# Patient Record
Sex: Male | Born: 1945 | Race: White | Hispanic: No | Marital: Married | Smoking: Current every day smoker
Health system: Southern US, Community
[De-identification: ages and names within clinical notes are randomized; demographics above are authoritative.]

## PROBLEM LIST (undated history)

## (undated) DIAGNOSIS — D494 Neoplasm of unspecified behavior of bladder: Secondary | ICD-10-CM

## (undated) DIAGNOSIS — I10 Essential (primary) hypertension: Secondary | ICD-10-CM

## (undated) DIAGNOSIS — R399 Unspecified symptoms and signs involving the genitourinary system: Secondary | ICD-10-CM

## (undated) DIAGNOSIS — I359 Nonrheumatic aortic valve disorder, unspecified: Secondary | ICD-10-CM

## (undated) DIAGNOSIS — C679 Malignant neoplasm of bladder, unspecified: Secondary | ICD-10-CM

## (undated) DIAGNOSIS — K219 Gastro-esophageal reflux disease without esophagitis: Secondary | ICD-10-CM

## (undated) DIAGNOSIS — Z8673 Personal history of transient ischemic attack (TIA), and cerebral infarction without residual deficits: Secondary | ICD-10-CM

## (undated) HISTORY — DX: Malignant neoplasm of bladder, unspecified: C67.9

## (undated) HISTORY — PX: CHOLECYSTECTOMY OPEN: SUR202

## (undated) HISTORY — DX: Nonrheumatic aortic valve disorder, unspecified: I35.9

---

## 2002-09-01 ENCOUNTER — Ambulatory Visit (HOSPITAL_BASED_OUTPATIENT_CLINIC_OR_DEPARTMENT_OTHER): Admission: RE | Admit: 2002-09-01 | Discharge: 2002-09-01 | Payer: Self-pay | Admitting: Orthopedic Surgery

## 2004-02-22 ENCOUNTER — Encounter: Admission: RE | Admit: 2004-02-22 | Discharge: 2004-02-22 | Payer: Self-pay | Admitting: Family Medicine

## 2004-09-24 ENCOUNTER — Emergency Department (HOSPITAL_COMMUNITY): Admission: EM | Admit: 2004-09-24 | Discharge: 2004-09-24 | Payer: Self-pay | Admitting: Emergency Medicine

## 2011-10-20 HISTORY — PX: OTHER SURGICAL HISTORY: SHX169

## 2012-07-04 LAB — COMPREHENSIVE METABOLIC PANEL
Albumin: 3.7 g/dL (ref 3.4–5.0)
Anion Gap: 6 — ABNORMAL LOW (ref 7–16)
BUN: 10 mg/dL (ref 7–18)
Calcium, Total: 8.8 mg/dL (ref 8.5–10.1)
Co2: 29 mmol/L (ref 21–32)
EGFR (Non-African Amer.): >60
Osmolality: 261 (ref 275–301)
Potassium: 4.1 mmol/L (ref 3.5–5.1)
SGPT (ALT): 23 U/L (ref 12–78)
Sodium: 130 mmol/L — ABNORMAL LOW (ref 136–145)

## 2012-07-04 LAB — CBC
HCT: 42.3 % (ref 40.0–52.0)
HGB: 15 g/dL (ref 13.0–18.0)
MCH: 32.1 pg (ref 26.0–34.0)
MCHC: 35.6 g/dL (ref 32.0–36.0)
MCV: 90 fL (ref 80–100)
RBC: 4.69 10*6/uL (ref 4.40–5.90)

## 2012-07-04 LAB — PROTIME-INR: Prothrombin Time: 12.2 secs (ref 11.5–14.7)

## 2012-07-05 ENCOUNTER — Observation Stay: Payer: Self-pay | Admitting: Internal Medicine

## 2012-07-05 LAB — URINALYSIS, COMPLETE
Bilirubin,UR: NEGATIVE
Nitrite: NEGATIVE
Protein: NEGATIVE
Specific Gravity: 1.008 (ref 1.003–1.030)

## 2012-07-05 LAB — TROPONIN I
Troponin-I: <0.02 ng/mL
Troponin-I: <0.02 ng/mL

## 2012-07-05 LAB — ETHANOL
Ethanol %: <0.003 % (ref 0.000–0.080)
Ethanol: <3 mg/dL

## 2012-07-05 LAB — CK TOTAL AND CKMB (NOT AT ARMC): CK-MB: 1.3 ng/mL (ref 0.5–3.6)

## 2012-08-29 ENCOUNTER — Encounter: Payer: Self-pay | Admitting: Internal Medicine

## 2012-09-18 ENCOUNTER — Encounter: Payer: Self-pay | Admitting: Internal Medicine

## 2013-07-09 ENCOUNTER — Observation Stay: Payer: Self-pay | Admitting: Internal Medicine

## 2013-07-09 LAB — COMPREHENSIVE METABOLIC PANEL
Albumin: 3.8 g/dL (ref 3.4–5.0)
Alkaline Phosphatase: 62 U/L (ref 50–136)
Anion Gap: 11 (ref 7–16)
BUN: 13 mg/dL (ref 7–18)
Bilirubin,Total: 0.5 mg/dL (ref 0.2–1.0)
Calcium, Total: 9 mg/dL (ref 8.5–10.1)
Co2: 20 mmol/L — ABNORMAL LOW (ref 21–32)
EGFR (African American): >60
EGFR (Non-African Amer.): 55 — ABNORMAL LOW
Glucose: 104 mg/dL — ABNORMAL HIGH (ref 65–99)
Osmolality: 252 (ref 275–301)
Potassium: 3.6 mmol/L (ref 3.5–5.1)
SGOT(AST): 26 U/L (ref 15–37)
SGPT (ALT): 22 U/L (ref 12–78)
Sodium: 125 mmol/L — ABNORMAL LOW (ref 136–145)
Total Protein: 7.2 g/dL (ref 6.4–8.2)

## 2013-07-09 LAB — CBC
HCT: 40.9 % (ref 40.0–52.0)
HGB: 14.3 g/dL (ref 13.0–18.0)
MCHC: 35 g/dL (ref 32.0–36.0)
MCV: 87 fL (ref 80–100)
Platelet: 262 10*3/uL (ref 150–440)
RBC: 4.68 10*6/uL (ref 4.40–5.90)
RDW: 12.4 % (ref 11.5–14.5)

## 2013-07-09 LAB — TROPONIN I: Troponin-I: 0.14 ng/mL — ABNORMAL HIGH

## 2013-07-09 LAB — CK TOTAL AND CKMB (NOT AT ARMC)
CK, Total: 46 U/L (ref 35–232)
CK-MB: 1.1 ng/mL (ref 0.5–3.6)

## 2013-07-10 LAB — CK TOTAL AND CKMB (NOT AT ARMC)
CK, Total: 84 U/L (ref 35–232)
CK-MB: 2.7 ng/mL (ref 0.5–3.6)
CK-MB: 3.2 ng/mL (ref 0.5–3.6)

## 2013-07-10 LAB — URINALYSIS, COMPLETE
Bacteria: NONE SEEN
Bacteria: NONE SEEN
Bilirubin,UR: NEGATIVE
Bilirubin,UR: NEGATIVE
Ketone: NEGATIVE
Leukocyte Esterase: NEGATIVE
Leukocyte Esterase: NEGATIVE
Ph: 5 (ref 4.5–8.0)
Ph: 6 (ref 4.5–8.0)
Protein: NEGATIVE
Protein: NEGATIVE
RBC,UR: 1 /HPF (ref 0–5)
Specific Gravity: 1.005 (ref 1.003–1.030)
Specific Gravity: 1.005 (ref 1.003–1.030)
Squamous Epithelial: NONE SEEN
Squamous Epithelial: <1
WBC UR: 2 /HPF (ref 0–5)
WBC UR: 3 /HPF (ref 0–5)

## 2013-07-10 LAB — BASIC METABOLIC PANEL
BUN: 13 mg/dL (ref 7–18)
Calcium, Total: 8.2 mg/dL — ABNORMAL LOW (ref 8.5–10.1)
Co2: 23 mmol/L (ref 21–32)
EGFR (African American): >60
EGFR (Non-African Amer.): >60
Osmolality: 257 (ref 275–301)
Potassium: 3.5 mmol/L (ref 3.5–5.1)
Sodium: 126 mmol/L — ABNORMAL LOW (ref 136–145)

## 2013-07-10 LAB — TROPONIN I
Troponin-I: 1.1 ng/mL — ABNORMAL HIGH
Troponin-I: 0.63 ng/mL — ABNORMAL HIGH

## 2013-07-10 LAB — SODIUM: Sodium: 126 mmol/L — ABNORMAL LOW (ref 136–145)

## 2014-07-12 ENCOUNTER — Other Ambulatory Visit: Payer: Self-pay | Admitting: Urology

## 2014-07-18 ENCOUNTER — Encounter (HOSPITAL_BASED_OUTPATIENT_CLINIC_OR_DEPARTMENT_OTHER): Payer: Self-pay | Admitting: *Deleted

## 2014-07-20 ENCOUNTER — Encounter (HOSPITAL_BASED_OUTPATIENT_CLINIC_OR_DEPARTMENT_OTHER): Payer: Self-pay | Admitting: *Deleted

## 2014-07-20 NOTE — Progress Notes (Signed)
SPOKE W/ WIFE. NPO AFTER MN. ARRIVE AT 0600. NEEDS ISTAT AND EKG.  

## 2014-07-22 NOTE — H&P (Signed)
History of Present Illness F/u - referred by Dr. Nicky Pugh.     1- gross hematuria - Sep 2015 - pt complained of grossly bloody urine on and off for about 4 months. He also had some occasional dysuria but also some frequency and urgency. He was treated with Cipro and felt like his symptoms and hematuria improved but they returned after he stopped the antibiotic. He has significant exposures. He is a smoker. Also used to mix chemotherapy as a chemotherapy nurse. He's never had radiation or chemotherapy himself. He has no urologic history. He's never had urologic surgery. He has no weak stream. Neurogenic risks include a stroke about 3 years ago. He's had no flank pain or stone passage.    A May 2015 bun was 13, cr 1.02. I did not see a UA.     2-PCa screening -   -May 2015 PSA 1.3  -Sep 2015 normal DRE      September 2015 interval history  The patient returns for cystoscopy. He's had some continued gross hematuria with some clots. He feels like he is voiding with a good stream and getting empty.    CT scan shows irregular anterior bladder wall mass possibly involving the dome. It appears to be T2 possibly T3. May be urachal.   Past Medical History Problems  1. History of Anxiety (300.00) 2. History of arthritis (V13.4) 3. History of asthma (V12.69) 4. History of depression (V11.8) 5. History of esophageal reflux (V12.79) 6. History of hypertension (V12.59) 7. History of stroke (V12.54)  Surgical History Problems  1. History of Cholecystectomy  Current Meds 1. Aspirin 81 MG Oral Tablet;  Therapy: (Recorded:09Sep2015) to Recorded 2. Losartan Potassium-HCTZ 100-25 MG Oral Tablet;  Therapy: (Recorded:09Sep2015) to Recorded 3. Multi-Vitamin TABS;  Therapy: (Recorded:09Sep2015) to Recorded 4. Pantoprazole Sodium 40 MG Oral Tablet Delayed Release;  Therapy: (Recorded:09Sep2015) to Recorded 5. Sertraline HCl - 100 MG Oral Tablet;  Therapy: (Recorded:09Sep2015) to  Recorded 6. Vitamin B-12 TABS;  Therapy: (Recorded:09Sep2015) to Recorded 7. Vitamin D-3 TABS;  Therapy: (Recorded:09Sep2015) to Recorded  Allergies Medication  1. Tagamet  Family History Problems  1. Family history of Deceased : Mother, Father 2. Family history of cardiac disorder (V17.49) : Mother, Grandmother 3. Family history of diabetes mellitus (V18.0) : Mother 4. Family history of prostate cancer (V16.42) : Grandfather 5. Family history of stroke (V17.1) : Mother  Social History Problems  1. Alcohol use (V49.89) 2. Caffeine use (V49.89) 3. Married 4. Retired 73. Smokes cigarettes (305.1)  Physical Exam Constitutional: Well nourished and well developed . No acute distress.  Pulmonary: No respiratory distress and normal respiratory rhythm and effort.  Cardiovascular: Heart rate and rhythm are normal . No peripheral edema.  Abdomen: The abdomen is soft and nontender. Bladder is not distended. No masses are palpated. No CVA tenderness. No hernias are palpable. No hepatosplenomegaly noted.  Neuro/Psych:. Mood and affect are appropriate.    Results/Data  The following images/tracing/specimen were independently visualized: Marland Kitchen    Procedure  Procedure: Cystoscopy   Indication: Hematuria. History of Urothelial Carcinoma.  Informed Consent: Risks, benefits, and potential adverse events were discussed and informed consent was obtained from the patient.  Prep: The patient was prepped with betadine.  Antibiotic prophylaxis: Ciprofloxacin.  Procedure Note:  Urethral meatus:. No abnormalities.  Anterior urethra: No abnormalities.  Prostatic urethra: No abnormalities.  Bladder: Visulization was clear. The ureteral orifices were in the normal anatomic position bilaterally and had clear efflux of urine. Multiple tumors were identified in  the bladder. This tumor was located near the dome of the bladder. The patient tolerated the procedure well.  Complications: None.     Plan Gross hematuria  1. Cysto; Status:Hold For - Date of Service; Requested [id number];  Health Maintenance  2. UA With REFLEX; [Do Not Release]; Status:In Progress - Specimen/Data Collected;    Done: 23Sep2015  Discussion/Summary We had a long discussion about the CT and cystoscopic findings. We discussed the nature of bladder neoplasms and the nature risk and benefits of TURBT. He did not want to bring his wife back for discussion. He said he wanted to tell her. Initially he said he wanted to do nothing. He said he did not want to do a TURBT. We discussed alternatives such as doing nothing. We discussed these tumors can metastasize quickly and be life threatening. He said he wanted to go home and sit on his porch and enjoy the few days he has left. I told him we needed to get more information and take things a step at a time. He asked about long-term implications and we discussed it depends on the findings on exam under anesthesia and TURBT, but management generally involve endoscopic surgery and sometimes cystectomy. Again we will know more after we do an exam under anesthesia, get a better look at the tumor in the OR and get some pathology. I don't palpate any SP masses today. All questions answered. He elected to proceed. I left the room for him to void which he did successfully. When I came back with the order sheet for surgery he had left. I went to find him and he did not stop at the desk to checkout. I have a front desk clerk calling him now to check on him.      Signatures Electronically signed by : Festus Aloe, M.D.; Jul 11 2014  1:37PM EST

## 2014-07-24 ENCOUNTER — Ambulatory Visit (HOSPITAL_BASED_OUTPATIENT_CLINIC_OR_DEPARTMENT_OTHER): Payer: Medicare HMO | Admitting: Anesthesiology

## 2014-07-24 ENCOUNTER — Encounter (HOSPITAL_BASED_OUTPATIENT_CLINIC_OR_DEPARTMENT_OTHER): Payer: Medicare HMO | Admitting: Anesthesiology

## 2014-07-24 ENCOUNTER — Ambulatory Visit (HOSPITAL_BASED_OUTPATIENT_CLINIC_OR_DEPARTMENT_OTHER)
Admission: RE | Admit: 2014-07-24 | Discharge: 2014-07-24 | Disposition: A | Discharge status: Home / Self Care | Payer: Medicare HMO | Source: Ambulatory Visit | Attending: Urology | Admitting: Urology

## 2014-07-24 ENCOUNTER — Encounter (HOSPITAL_BASED_OUTPATIENT_CLINIC_OR_DEPARTMENT_OTHER): Payer: Self-pay | Admitting: Anesthesiology

## 2014-07-24 ENCOUNTER — Encounter (HOSPITAL_BASED_OUTPATIENT_CLINIC_OR_DEPARTMENT_OTHER)
Admission: RE | Disposition: A | Discharge status: Home / Self Care | Payer: Self-pay | Source: Ambulatory Visit | Attending: Urology

## 2014-07-24 DIAGNOSIS — D494 Neoplasm of unspecified behavior of bladder: Secondary | ICD-10-CM | POA: Diagnosis present

## 2014-07-24 DIAGNOSIS — F329 Major depressive disorder, single episode, unspecified: Secondary | ICD-10-CM | POA: Diagnosis not present

## 2014-07-24 DIAGNOSIS — Z8673 Personal history of transient ischemic attack (TIA), and cerebral infarction without residual deficits: Secondary | ICD-10-CM | POA: Diagnosis not present

## 2014-07-24 DIAGNOSIS — D090 Carcinoma in situ of bladder: Secondary | ICD-10-CM | POA: Insufficient documentation

## 2014-07-24 DIAGNOSIS — F419 Anxiety disorder, unspecified: Secondary | ICD-10-CM | POA: Insufficient documentation

## 2014-07-24 DIAGNOSIS — I10 Essential (primary) hypertension: Secondary | ICD-10-CM | POA: Diagnosis not present

## 2014-07-24 DIAGNOSIS — J45909 Unspecified asthma, uncomplicated: Secondary | ICD-10-CM | POA: Insufficient documentation

## 2014-07-24 DIAGNOSIS — F17210 Nicotine dependence, cigarettes, uncomplicated: Secondary | ICD-10-CM | POA: Insufficient documentation

## 2014-07-24 DIAGNOSIS — M1990 Unspecified osteoarthritis, unspecified site: Secondary | ICD-10-CM | POA: Diagnosis not present

## 2014-07-24 DIAGNOSIS — K219 Gastro-esophageal reflux disease without esophagitis: Secondary | ICD-10-CM | POA: Diagnosis not present

## 2014-07-24 HISTORY — DX: Essential (primary) hypertension: I10

## 2014-07-24 HISTORY — PX: TRANSURETHRAL RESECTION OF BLADDER TUMOR: SHX2575

## 2014-07-24 HISTORY — DX: Personal history of transient ischemic attack (TIA), and cerebral infarction without residual deficits: Z86.73

## 2014-07-24 HISTORY — DX: Neoplasm of unspecified behavior of bladder: D49.4

## 2014-07-24 HISTORY — DX: Unspecified symptoms and signs involving the genitourinary system: R39.9

## 2014-07-24 HISTORY — DX: Gastro-esophageal reflux disease without esophagitis: K21.9

## 2014-07-24 LAB — POCT I-STAT 4, (NA,K, GLUC, HGB,HCT)
Glucose, Bld: 107 mg/dL — ABNORMAL HIGH (ref 70–99)
HEMATOCRIT: 42 % (ref 39.0–52.0)
HEMOGLOBIN: 14.3 g/dL (ref 13.0–17.0)
Potassium: 4.2 mEq/L (ref 3.7–5.3)
Sodium: 127 mEq/L — ABNORMAL LOW (ref 137–147)

## 2014-07-24 SURGERY — TURBT (TRANSURETHRAL RESECTION OF BLADDER TUMOR)
Anesthesia: General | Site: Bladder

## 2014-07-24 MED ORDER — OXYCODONE HCL 5 MG/5ML PO SOLN
5.0000 mg | Freq: Once | ORAL | Status: DC | PRN
  Filled 2014-07-24: qty 5

## 2014-07-24 MED ORDER — OXYBUTYNIN CHLORIDE 5 MG PO TABS: 5.0000 mg | ORAL_TABLET | Freq: Three times a day (TID) | ORAL | Status: DC | PRN

## 2014-07-24 MED ORDER — FENTANYL CITRATE 0.05 MG/ML IJ SOLN
INTRAMUSCULAR | Status: AC
  Filled 2014-07-24: qty 6

## 2014-07-24 MED ORDER — CEFAZOLIN SODIUM-DEXTROSE 2-3 GM-% IV SOLR
2.0000 g | INTRAVENOUS | Status: AC
  Administered 2014-07-24: 2 g via INTRAVENOUS
  Filled 2014-07-24: qty 50

## 2014-07-24 MED ORDER — FENTANYL CITRATE 0.05 MG/ML IJ SOLN
INTRAMUSCULAR | Status: DC | PRN
  Administered 2014-07-24 (×3): 12.5 ug via INTRAVENOUS
  Administered 2014-07-24 (×2): 25 ug via INTRAVENOUS
  Administered 2014-07-24: 12.5 ug via INTRAVENOUS
  Administered 2014-07-24: 25 ug via INTRAVENOUS
  Administered 2014-07-24: 12.5 ug via INTRAVENOUS
  Administered 2014-07-24: 25 ug via INTRAVENOUS
  Administered 2014-07-24: 12.5 ug via INTRAVENOUS
  Administered 2014-07-24: 25 ug via INTRAVENOUS

## 2014-07-24 MED ORDER — BELLADONNA ALKALOIDS-OPIUM 16.2-60 MG RE SUPP
RECTAL | Status: DC | PRN
  Administered 2014-07-24: 1 via RECTAL

## 2014-07-24 MED ORDER — LACTATED RINGERS IV SOLN
INTRAVENOUS | Status: DC
Start: 2014-07-24 — End: 2014-07-24
  Administered 2014-07-24: 07:00:00 via INTRAVENOUS
  Filled 2014-07-24: qty 1000

## 2014-07-24 MED ORDER — MEPERIDINE HCL 25 MG/ML IJ SOLN
6.2500 mg | INTRAMUSCULAR | Status: DC | PRN
  Filled 2014-07-24: qty 1

## 2014-07-24 MED ORDER — LIDOCAINE HCL 2 % EX GEL
CUTANEOUS | Status: DC | PRN
  Administered 2014-07-24: 1 via URETHRAL

## 2014-07-24 MED ORDER — EPHEDRINE SULFATE 50 MG/ML IJ SOLN
INTRAMUSCULAR | Status: DC | PRN
  Administered 2014-07-24 (×3): 10 mg via INTRAVENOUS

## 2014-07-24 MED ORDER — PROMETHAZINE HCL 25 MG/ML IJ SOLN
6.2500 mg | INTRAMUSCULAR | Status: DC | PRN
  Filled 2014-07-24: qty 1

## 2014-07-24 MED ORDER — OXYCODONE HCL 5 MG PO TABS
5.0000 mg | ORAL_TABLET | Freq: Once | ORAL | Status: DC | PRN
  Filled 2014-07-24: qty 1

## 2014-07-24 MED ORDER — SODIUM CHLORIDE 0.9 % IR SOLN
Status: DC | PRN
  Administered 2014-07-24: 36000 mL via INTRAVESICAL

## 2014-07-24 MED ORDER — LIDOCAINE HCL (CARDIAC) 20 MG/ML IV SOLN
INTRAVENOUS | Status: DC | PRN
  Administered 2014-07-24: 100 mg via INTRAVENOUS

## 2014-07-24 MED ORDER — STERILE WATER FOR IRRIGATION IR SOLN
Status: DC | PRN
  Administered 2014-07-24: 500 mL

## 2014-07-24 MED ORDER — CEPHALEXIN 500 MG PO CAPS: 500.0000 mg | ORAL_CAPSULE | Freq: Every day | ORAL | Status: DC

## 2014-07-24 MED ORDER — ACETAMINOPHEN 10 MG/ML IV SOLN
INTRAVENOUS | Status: DC | PRN
  Administered 2014-07-24: 1000 mg via INTRAVENOUS

## 2014-07-24 MED ORDER — ONDANSETRON HCL 4 MG/2ML IJ SOLN
INTRAMUSCULAR | Status: DC | PRN
  Administered 2014-07-24: 4 mg via INTRAVENOUS

## 2014-07-24 MED ORDER — LACTATED RINGERS IV SOLN
INTRAVENOUS | Status: DC | PRN
  Administered 2014-07-24 (×2): via INTRAVENOUS

## 2014-07-24 MED ORDER — MIDAZOLAM HCL 5 MG/5ML IJ SOLN
INTRAMUSCULAR | Status: DC | PRN
  Administered 2014-07-24: 1 mg via INTRAVENOUS
  Administered 2014-07-24 (×2): 0.5 mg via INTRAVENOUS

## 2014-07-24 MED ORDER — CEFAZOLIN SODIUM 1-5 GM-% IV SOLN
1.0000 g | INTRAVENOUS | Status: DC
  Filled 2014-07-24: qty 50

## 2014-07-24 MED ORDER — PROPOFOL 10 MG/ML IV BOLUS
INTRAVENOUS | Status: DC | PRN
Start: 2014-07-24 — End: 2014-07-24
  Administered 2014-07-24: 200 mg via INTRAVENOUS

## 2014-07-24 MED ORDER — BELLADONNA ALKALOIDS-OPIUM 16.2-60 MG RE SUPP
RECTAL | Status: AC
  Filled 2014-07-24: qty 1

## 2014-07-24 MED ORDER — MIDAZOLAM HCL 2 MG/2ML IJ SOLN
INTRAMUSCULAR | Status: AC
  Filled 2014-07-24: qty 2

## 2014-07-24 MED ORDER — HYDROMORPHONE HCL 1 MG/ML IJ SOLN
0.2500 mg | INTRAMUSCULAR | Status: DC | PRN
  Filled 2014-07-24: qty 1

## 2014-07-24 MED ORDER — DEXAMETHASONE SODIUM PHOSPHATE 4 MG/ML IJ SOLN
INTRAMUSCULAR | Status: DC | PRN
  Administered 2014-07-24: 10 mg via INTRAVENOUS

## 2014-07-24 MED ORDER — GLYCOPYRROLATE 0.2 MG/ML IJ SOLN
INTRAMUSCULAR | Status: DC | PRN
  Administered 2014-07-24: 0.2 mg via INTRAVENOUS

## 2014-07-24 MED ORDER — CEFAZOLIN SODIUM-DEXTROSE 2-3 GM-% IV SOLR
INTRAVENOUS | Status: AC
  Filled 2014-07-24: qty 50

## 2014-07-24 SURGICAL SUPPLY — 34 items
BAG DRAIN URO-CYSTO SKYTR STRL (DRAIN) ×3 IMPLANT
BAG DRN ANRFLXCHMBR STRAP LEK (BAG)
BAG DRN UROCATH (DRAIN) ×1
BAG URINE DRAINAGE (UROLOGICAL SUPPLIES) ×2 IMPLANT
BAG URINE LEG 19OZ MD ST LTX (BAG) IMPLANT
CANISTER SUCT LVC 12 LTR MEDI- (MISCELLANEOUS) ×12 IMPLANT
CATH FOLEY 2WAY SLVR  5CC 20FR (CATHETERS)
CATH FOLEY 2WAY SLVR  5CC 22FR (CATHETERS)
CATH FOLEY 2WAY SLVR 5CC 20FR (CATHETERS) IMPLANT
CATH FOLEY 2WAY SLVR 5CC 22FR (CATHETERS) IMPLANT
CLOTH BEACON ORANGE TIMEOUT ST (SAFETY) ×3 IMPLANT
DRAPE CAMERA CLOSED 9X96 (DRAPES) ×3 IMPLANT
DRESSING TELFA 8X3 (GAUZE/BANDAGES/DRESSINGS) IMPLANT
ELECT BUTTON BIOP 24F 90D PLAS (MISCELLANEOUS) IMPLANT
ELECT LOOP HF 26F 30D .35MM (CUTTING LOOP) IMPLANT
ELECT LOOP MED HF 24F 12D CBL (CLIP) ×2 IMPLANT
ELECT REM PT RETURN 9FT ADLT (ELECTROSURGICAL)
ELECTRODE REM PT RTRN 9FT ADLT (ELECTROSURGICAL) ×1 IMPLANT
EVACUATOR MICROVAS BLADDER (UROLOGICAL SUPPLIES) IMPLANT
FOLEY ×2 IMPLANT
GLOVE BIO SURGEON STRL SZ7.5 (GLOVE) ×1 IMPLANT
GLOVE ECLIPSE 6.0 STRL STRAW (GLOVE) ×2 IMPLANT
GLOVE ECLIPSE 6.5 STRL STRAW (GLOVE) ×2 IMPLANT
GLOVE ECLIPSE 7.0 STRL STRAW (GLOVE) ×2 IMPLANT
GLOVE ECLIPSE 7.5 STRL STRAW (GLOVE) ×2 IMPLANT
GOWN STRL REIN XL XLG (GOWN DISPOSABLE) ×1 IMPLANT
GOWN STRL REUS W/ TWL XL LVL3 (GOWN DISPOSABLE) IMPLANT
GOWN STRL REUS W/TWL XL LVL3 (GOWN DISPOSABLE) ×3
HOLDER FOLEY CATH W/STRAP (MISCELLANEOUS) ×2 IMPLANT
LOOP CUTTING 24FR OLYMPUS (CUTTING LOOP) IMPLANT
PACK CYSTO (CUSTOM PROCEDURE TRAY) ×3 IMPLANT
PLUG CATH AND CAP STER (CATHETERS) IMPLANT
SET ASPIRATION TUBING (TUBING) ×2 IMPLANT
SYRINGE IRR TOOMEY STRL 70CC (SYRINGE) ×2 IMPLANT

## 2014-07-24 NOTE — Anesthesia Preprocedure Evaluation (Signed)
Anesthesia Evaluation  Patient identified by MRN, date of birth, ID band Patient awake    Reviewed: Allergy & Precautions, H&P , NPO status , Patient's Chart, lab work & pertinent test results  Airway Mallampati: II TM Distance: >3 FB Neck ROM: Full    Dental no notable dental hx.    Pulmonary Current Smoker,  breath sounds clear to auscultation  Pulmonary exam normal       Cardiovascular hypertension, Pt. on medications Rhythm:Regular Rate:Normal     Neuro/Psych TIAnegative psych ROS   GI/Hepatic Neg liver ROS, GERD-  Medicated,  Endo/Other  negative endocrine ROS  Renal/GU negative Renal ROS     Musculoskeletal negative musculoskeletal ROS (+)   Abdominal   Peds  Hematology negative hematology ROS (+)   Anesthesia Other Findings   Reproductive/Obstetrics                           Anesthesia Physical Anesthesia Plan  ASA: II  Anesthesia Plan: General   Post-op Pain Management:    Induction: Intravenous  Airway Management Planned: LMA  Additional Equipment:   Intra-op Plan:   Post-operative Plan: Extubation in OR  Informed Consent: I have reviewed the patients History and Physical, chart, labs and discussed the procedure including the risks, benefits and alternatives for the proposed anesthesia with the patient or authorized representative who has indicated his/her understanding and acceptance.   Dental advisory given  Plan Discussed with: CRNA  Anesthesia Plan Comments:         Anesthesia Quick Evaluation

## 2014-07-24 NOTE — Interval H&P Note (Signed)
History and Physical Interval Note:  07/24/2014 7:33 AM  Donald Decker  has presented today for surgery, with the diagnosis of Bladder Neoplasm  The various methods of treatment have been discussed with the patient and family. After consideration of risks, benefits and other options for treatment, the patient has consented to  Procedure(s): TRANSURETHRAL RESECTION OF BLADDER TUMOR (TURBT) (N/A) as a surgical intervention .  The patient's history has been reviewed, patient examined, no change in status, stable for surgery.  I have reviewed the patient's chart and labs.  Questions were answered to the patient's satisfaction.  Pt having frequency and gross hematuria -- likely from large tumor. Discussed side effects of the proposed treatment, the likelihood of the patient achieving the goals of the procedure, and any potential problems that might occur during the procedure or recuperation. All questions answered. Patient elects to proceed. Discussed likely need for foley post-op and possible need for repeat TURBT in a few weeks for staging purposes. Pt sodium low but he and wife report that is normal for patient. Has had low sodium for many yrs.     Festus Aloe

## 2014-07-24 NOTE — Addendum Note (Signed)
Addendum created 07/24/14 1131 by Justice Rocher, CRNA   Modules edited: Charges VN

## 2014-07-24 NOTE — Discharge Instructions (Signed)
Foley Catheter Care °A Foley catheter is a soft, flexible tube that is placed into the bladder to drain urine. A Foley catheter may be inserted if: °· You leak urine or are not able to control when you urinate (urinary incontinence). °· You are not able to urinate when you need to (urinary retention). °· You had prostate surgery or surgery on the genitals. °· You have certain medical conditions, such as multiple sclerosis, dementia, or a spinal cord injury. °If you are going home with a Foley catheter in place, follow the instructions below. °TAKING CARE OF THE CATHETER °1. Wash your hands with soap and water. °2. Using mild soap and warm water on a clean washcloth: °· Clean the area on your body closest to the catheter insertion site using a circular motion, moving away from the catheter. Never wipe toward the catheter because this could sweep bacteria up into the urethra and cause infection. °· Remove all traces of soap. Pat the area dry with a clean towel. For males, reposition the foreskin. °3. Attach the catheter to your leg so there is no tension on the catheter. Use adhesive tape or a leg strap. If you are using adhesive tape, remove any sticky residue left behind by the previous tape you used. °4. Keep the drainage bag below the level of the bladder, but keep it off the floor. °5. Check throughout the day to be sure the catheter is working and urine is draining freely. Make sure the tubing does not become kinked. °6. Do not pull on the catheter or try to remove it. Pulling could damage internal tissues. °TAKING CARE OF THE DRAINAGE BAGS °You will be given two drainage bags to take home. One is a large overnight drainage bag, and the other is a smaller leg bag that fits underneath clothing. You may wear the overnight bag at any time, but you should never wear the smaller leg bag at night. Follow the instructions below for how to empty, change, and clean your drainage bags. °Emptying the Drainage Bag °You must  empty your drainage bag when it is  -½ full or at least 2-3 times a day. °1. Wash your hands with soap and water. °2. Keep the drainage bag below your hips, below the level of your bladder. This stops urine from going back into the tubing and into your bladder. °3. Hold the dirty bag over the toilet or a clean container. °4. Open the pour spout at the bottom of the bag and empty the urine into the toilet or container. Do not let the pour spout touch the toilet, container, or any other surface. Doing so can place bacteria on the bag, which can cause an infection. °5. Clean the pour spout with a gauze pad or cotton ball that has rubbing alcohol on it. °6. Close the pour spout. °7. Attach the bag to your leg with adhesive tape or a leg strap. °8. Wash your hands well. °Changing the Drainage Bag °Change your drainage bag once a month or sooner if it starts to smell bad or look dirty. Below are steps to follow when changing the drainage bag. °1. Wash your hands with soap and water. °2. Pinch off the rubber catheter so that urine does not spill out. °3. Disconnect the catheter tube from the drainage tube at the connection valve. Do not let the tubes touch any surface. °4. Clean the end of the catheter tube with an alcohol wipe. Use a different alcohol wipe to clean the   end of the drainage tube. 5. Connect the catheter tube to the drainage tube of the clean drainage bag. 6. Attach the new bag to the leg with adhesive tape or a leg strap. Avoid attaching the new bag too tightly. 7. Wash your hands well. Cleaning the Drainage Bag 1. Wash your hands with soap and water. 2. Wash the bag in warm, soapy water. 3. Rinse the bag thoroughly with warm water. 4. Fill the bag with a solution of white vinegar and water (1 cup vinegar to 1 qt warm water [.2 L vinegar to 1 L warm water]). Close the bag and soak it for 30 minutes in the solution. 5. Rinse the bag with warm water. 6. Hang the bag to dry with the pour spout open  and hanging downward. 7. Store the clean bag (once it is dry) in a clean plastic bag. 8. Wash your hands well. PREVENTING INFECTION  Wash your hands before and after handling your catheter.  Take showers daily and wash the area where the catheter enters your body. Do not take baths. Replace wet leg straps with dry ones, if this applies.  Do not use powders, sprays, or lotions on the genital area. Only use creams, lotions, or ointments as directed by your caregiver.  For females, wipe from front to back after each bowel movement.  Drink enough fluids to keep your urine clear or pale yellow unless you have a fluid restriction.  Do not let the drainage bag or tubing touch or lie on the floor.  Wear cotton underwear to absorb moisture and to keep your skin drier. SEEK MEDICAL CARE IF:   Your urine is cloudy or smells unusually bad.  Your catheter becomes clogged.  You are not draining urine into the bag or your bladder feels full.  Your catheter starts to leak. SEEK IMMEDIATE MEDICAL CARE IF:   You have pain, swelling, redness, or pus where the catheter enters the body.  You have pain in the abdomen, legs, lower back, or bladder.  You have a fever.  You see blood fill the catheter, or your urine is pink or red.  You have nausea, vomiting, or chills.  Your catheter gets pulled out. MAKE SURE YOU:   Understand these instructions.  Will watch your condition.  Will get help right away if you are not doing well or get worse. Foley removal: Patient may remove foley catheter Thursday or Friday morning. Or if it's not bothering him, he can keep it and we can remove in office next week on follow-up.    Cystoscopy, Care After Refer to this sheet in the next few weeks. These instructions provide you with information on caring for yourself after your procedure. Your caregiver may also give you more specific instructions. Your treatment has been planned according to current  medical practices, but problems sometimes occur. Call your caregiver if you have any problems or questions after your procedure. HOME CARE INSTRUCTIONS  Things you can do to ease any discomfort after your procedure include:  Drinking enough water and fluids to keep your urine clear or pale yellow.  Taking a warm bath to relieve any burning feelings. SEEK IMMEDIATE MEDICAL CARE IF:   You have an increase in blood in your urine.  You notice blood clots in your urine.  You have difficulty passing urine.  You have the chills.  You have abdominal pain.  You have a fever or persistent symptoms for more than 2-3 days.  You have a  fever and your symptoms suddenly get worse. MAKE SURE YOU:   Understand these instructions.  Will watch your condition.  Will get help right away if you are not doing well or get worse. Document Released: 04/24/2005 Document Revised: 06/07/2013 Document Reviewed: 03/28/2012 [facility] Patient Information 2015 [place], [place]. This information is not intended to replace advice given to you by your health care provider. Make sure you discuss any questions you have with your health care provider.   Transurethral Resection, Bladder Tumor A cancerous growth (tumor) can develop on the inside wall of the bladder. The bladder is the organ that holds urine. One way to remove the tumor is a procedure called a transurethral resection. The tumor is removed (resected) through the tube that carries urine from the bladder out of the body (urethra). No cuts (incisions) are made in the skin. Instead, the procedure is done through a thin telescope, called a resectoscope. Attached to it is a light and usually a tiny camera. The resectoscope is put into the urethra. In men, the urethra opens at the end of the penis. In women, it opens just above the vagina.  A transurethral resection is usually used to remove tumors that have not gotten too big or too deep. These are called Stage 0,  Stage 1 or Stage 2 bladder cancers. LET YOUR CAREGIVER KNOW ABOUT:  On the day of the procedure, your caregivers will need to know the last time you had anything to eat or drink. This includes water, gum, and candy. In advance, make sure they know about:   Any allergies.  All medications you are taking, including:  Herbs, eyedrops, over-the-counter medications and creams.  Blood thinners (anticoagulants), aspirin or other drugs that could affect blood clotting.  Use of steroids (by mouth or as creams).  Previous problems with anesthetics, including local anesthetics.  Possibility of pregnancy, if this applies.  Any history of blood clots.  Any history of bleeding or other blood problems.  Previous surgery.  Smoking history.  Any recent symptoms of colds or infections.  Other health problems. RISKS AND COMPLICATIONS This is usually a safe procedure. Every procedure has risks, though. For a transurethral resection, they include:  Infection. Antibiotic medication would need to be taken.  Bleeding.  Light bleeding may last for several days after the procedure.  If bleeding continues or is heavy, the bladder may need rinsing. Or, a new catheter might be put in for awhile.  Sometimes bed rest is needed.  Urination problems.  Pain and burning can occur when urinating. This usually goes away in a few days.  Scarring from the procedure can block the flow of urine.  Bladder damage.  It can be punctured or torn during removal of the tumor. If this happens, a catheter might be needed for longer. Antibiotics would be taken while the bladder heals.  Urine can leak through the hole or tear into the abdomen. If this happens, surgery may be needed to repair the bladder. BEFORE THE PROCEDURE   A medical evaluation will be done. This may include:  A physical examination.  Urine test. This is to make sure you do not have a urinary tract infection.  Blood tests.  A test that  checks the heart's rhythm (electrocardiogram).  Talking with an anesthesiologist. This is the person who will be in charge of the medication (anesthesia) to keep you from feeling pain during the transurethral resection. You might be asleep during the procedure (general anesthesia) or numb from the  waist down, but awake during the procedure (spinal anesthesia). Ask your surgeon what to expect.  The person who is having a transurethral resection needs to give what is called informed consent. This requires signing a legal paper that gives permission for the procedure. To give informed consent:  You must understand how the procedure is done and why.  You must be told all the risks and benefits of the procedure.  You must sign the consent. Sometimes a legal guardian can do this.  Signing should be witnessed by a healthcare professional.  The day before the surgery, eat only a light dinner. Then, do not eat or drink anything for at least 8 hours before the surgery. Ask your caregiver if it is OK to take any needed medicines with a sip of water.  Arrive at least an hour before the surgery or whenever your surgeon recommends. This will give you time to check in and fill out any needed paperwork. PROCEDURE  The preparation:  You will change into a hospital gown.  A needle will be inserted in your arm. This is an intravenous access tube (IV). Medication will be able to flow directly into your body through this needle.  Small monitors will be put on your body. They are used to check your heart, blood pressure, and oxygen level.  You might be given medication that will help you relax (sedative).  You will be given a general anesthetic or spinal anesthesia.  The procedure:  Once you are asleep or numb from the waist down, your legs will be placed in stirrups.  The resectoscope will be passed through the urethra into the bladder.  Fluid will be passed through the resectoscope. This will fill  the bladder with water.  The surgeon will examine the bladder through the scope. If the scope has a camera, it can take pictures from inside the bladder. They can be projected onto a TV screen.  The surgeon will use various tools to remove the tumor in small pieces. Sometimes a laser (a beam of light energy) is used. Other tools may use electric current.  A tube (catheter) will often be placed so that urine can drain into a bag outside the body. This process helps stop bleeding. This tube keeps blood clots from blocking the urethra.  The procedure usually takes 30 to 45 minutes. AFTER THE PROCEDURE   You will stay in a recovery area until the anesthesia has worn off. Your blood pressure and pulse will be checked every so often. Then you will be taken to a hospital room.  You may continue to get fluids through the IV for awhile.  Some pain is normal. The catheter might be uncomfortable. Pain is usually not severe. If it is, ask for pain medicine.  Your urine may look bloody after a transurethral resection. This is normal.  If bleeding is heavy, a hospital caregiver may rinse out the bladder (irrigation) through the catheter.  Once the urine is clear, the catheter will be taken out.  You will need to stay in the hospital until you can urinate on your own.  Most people stay in the hospital for up to 4 days. PROGNOSIS   Transurethral resection is considered the best way to treat bladder tumors that are not too far along. For most people, the treatment is successful. Sometimes, though, more treatment is needed.  Bladder cancers can come back even after a successful procedure. Because of this, be sure to have a checkup with your  caregiver every 3 to 6 months. If everything is OK for 3 years, you can reduce the checkups to once a year. Document Released: 08/01/2009 Document Revised: 12/28/2011 Document Reviewed: 11/14/2013 [facility] Patient Information 2015 [place], [place]. This information  is not intended to replace advice given to you by your health care provider. Make sure you discuss any questions you have with your health care provider.    Post Anesthesia Home Care Instructions  Activity: Get plenty of rest for the remainder of the day. A responsible adult should stay with you for 24 hours following the procedure.  For the next 24 hours, DO NOT: -Drive a car -Paediatric nurse -Drink alcoholic beverages -Take any medication unless instructed by your physician -Make any legal decisions or sign important papers.  Meals: Start with liquid foods such as gelatin or soup. Progress to regular foods as tolerated. Avoid greasy, spicy, heavy foods. If nausea and/or vomiting occur, drink only clear liquids until the nausea and/or vomiting subsides. Call your physician if vomiting continues.  Special Instructions/Symptoms: Your throat may feel dry or sore from the anesthesia or the breathing tube placed in your throat during surgery. If this causes discomfort, gargle with warm salt water. The discomfort should disappear within 24 hours.

## 2014-07-24 NOTE — Anesthesia Postprocedure Evaluation (Signed)
Anesthesia Post Note  Patient: Donald Decker  Procedure(s) Performed: Procedure(s) (LRB): TRANSURETHRAL RESECTION OF BLADDER TUMOR (TURBT) GREATER THAN 5 CMS (N/A)  Anesthesia type: General  Patient location: PACU  Post pain: Pain level controlled  Post assessment: Post-op Vital signs reviewed  Last Vitals: BP 119/65  Pulse 102  Temp(Src) 36.4 C (Oral)  Resp 18  Ht 5\' 10"  (1.778 m)  Wt 187 lb (84.823 kg)  BMI 26.83 kg/m2  SpO2 96%  Post vital signs: Reviewed  Level of consciousness: sedated  Complications: No apparent anesthesia complications

## 2014-07-24 NOTE — Transfer of Care (Signed)
Immediate Anesthesia Transfer of Care Note  Patient: Donald Decker  Procedure(s) Performed: Procedure(s) (LRB): TRANSURETHRAL RESECTION OF BLADDER TUMOR (TURBT) GREATER THAN 5 CMS (N/A)  Patient Location: PACU  Anesthesia Type: General  Level of Consciousness: awake, sedated, patient cooperative and responds to stimulation  Airway & Oxygen Therapy: Patient Spontanous Breathing and Patient connected to face mask oxygen  Post-op Assessment: Report given to PACU RN, Post -op Vital signs reviewed and stable and Patient moving all extremities  Post vital signs: Reviewed and stable  Complications: No apparent anesthesia complications

## 2014-07-24 NOTE — Op Note (Signed)
Preoperative diagnosis: Bladder neoplasm Postoperative diagnosis: Bladder neoplasm  Procedure: Exam under anesthesia, TURBT greater than 5 cm  Surgeon: Junious Silk  Anesthesia: [place]   Type of anesthesia: Gen.   Findings: On exam under anesthesia the prostate was mildly enlarged but smooth without heart area or nodule. Landmarks were preserved. On bimanual exam there were no palpable bladder or abdominal masses. Nothing was fixed or indurated.  Cystoscopy revealed multifocal bladder tumors scattered along the right bladder neck, middle of the trigone, posterior wall, right wall, left wall. Again at the dome there was a large invasive-appearing bladder tumor along the anterior bladder neck and anterior dome, behind this there was another solid invasive-appearing bladder tumor. Both large tumors were shaved down to the level of the bladder wall but they continued. There were not completely resected. The resection sites were fulgurated with the button and hemostasis was excellent. The trigone and some superficial tumor in the middle of the ureteral orifices were spared. There was excellent efflux throughout the case and at the end after all resection and fulguration. Ureteral orifices were not involved in the resection.  Description of procedure: After consent was obtained patient brought to the operating room. After adequate anesthesia the patient was placed in lithotomy position and prepped and draped in the usual sterile fashion. A timeout was performed to confirm the patient and procedure I did an exam under anesthesia placed a B&O suppository. Cystoscope was passed per urethra and the bladder inspected with the 12 and 70 lens. Photographs taken. The scope was removed and the urethral meatus dilated to 28 [place]. A 26 French resectoscope was passed with the visual obturator. The cold cup was passed biopsy the posterior superficial-appearing tumor. Next the cold cup was used to biopsy the trigone.  These were representative of the superficial-appearing tumor that covered much of the bladder. The loop was then passed and I resected the right bladder neck. This was sent separately. Next the large tumor on the anterior bladder neck and dome was resected and sent as anterior tumor. This was fulgurated with the button. Finally the largest tumor the posterior down was resected. This was sent as posterior dome. This was fulgurated with the button. Hemostasis was excellent at low-pressure. The scope was removed and an 77 French Foley placed. It was left gravity drainage draining clear urine. Patient was awakened taken to the recovery room in stable condition.   Complications: None   Blood loss: Minimal   Drains: 18 French Foley nonlatex   Specimens to pathology: #1 posterior biopsy  #2 trigone biopsy  #3 bladder neck left - TUR #4 anterior dome tumor - TUR #5 posterior dome tumor -TUR  Disposition: Patient stable to PACU

## 2014-07-25 ENCOUNTER — Encounter (HOSPITAL_BASED_OUTPATIENT_CLINIC_OR_DEPARTMENT_OTHER): Payer: Self-pay | Admitting: Urology

## 2014-07-31 ENCOUNTER — Telehealth: Payer: Self-pay | Admitting: Hematology and Oncology

## 2014-07-31 NOTE — Telephone Encounter (Signed)
S/W PATIENT WIFE AND GAVE NP APPT FOR 10/22 @ 1:30 W/DR. SHADAD.  REFERRING DR. 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

## 2014-08-01 ENCOUNTER — Telehealth: Payer: Self-pay | Admitting: Oncology

## 2014-08-01 NOTE — Telephone Encounter (Signed)
C/D 08/01/14 for appt. 08/09/14 °

## 2014-08-09 ENCOUNTER — Other Ambulatory Visit: Payer: Medicare HMO

## 2014-08-09 ENCOUNTER — Other Ambulatory Visit: Payer: Self-pay | Admitting: *Deleted

## 2014-08-09 ENCOUNTER — Ambulatory Visit (HOSPITAL_BASED_OUTPATIENT_CLINIC_OR_DEPARTMENT_OTHER): Payer: Medicare HMO | Admitting: Oncology

## 2014-08-09 ENCOUNTER — Ambulatory Visit: Payer: Medicare HMO

## 2014-08-09 ENCOUNTER — Encounter: Payer: Self-pay | Admitting: Oncology

## 2014-08-09 ENCOUNTER — Telehealth: Payer: Self-pay | Admitting: Oncology

## 2014-08-09 VITALS — BP 155/69 | HR 74 | Temp 97.7°F | Resp 18 | Ht 70.0 in | Wt 189.4 lb

## 2014-08-09 DIAGNOSIS — C678 Malignant neoplasm of overlapping sites of bladder: Secondary | ICD-10-CM

## 2014-08-09 DIAGNOSIS — N289 Disorder of kidney and ureter, unspecified: Secondary | ICD-10-CM

## 2014-08-09 DIAGNOSIS — C670 Malignant neoplasm of trigone of bladder: Secondary | ICD-10-CM | POA: Insufficient documentation

## 2014-08-09 MED ORDER — PROCHLORPERAZINE MALEATE 10 MG PO TABS: 10.0000 mg | ORAL_TABLET | Freq: Four times a day (QID) | ORAL | Status: DC | PRN

## 2014-08-09 NOTE — Telephone Encounter (Signed)
Pt confirmed labs/ov per 10/22 POF,sent msg to add chemo, gave pt AVS....... KJ °

## 2014-08-09 NOTE — Progress Notes (Signed)
Checked in new pt with no financial concerns at this time.  Informed pt of the different foundations that offer copay assistance for chemo if needed as well as if Josem Kaufmann is req I will obtain that. Pt has my card for any questions or concerns.

## 2014-08-09 NOTE — Progress Notes (Signed)
Please see consult note.  

## 2014-08-09 NOTE — Consult Note (Signed)
Reason for Referral: Bladder cancer.   HPI: 68 year old gentleman of [place], [place] he lived the majority of his life. He worked predominantly as a Marine scientist and was also in [employer] for a period of time. He does have a history of hypertension also have a stroke in 2013 that left him with some residual overall weakness. He was in his usual state of health to he started developing symptoms of hematuria last year. He was subsequently evaluated by Dr. Junious Silk from urology. He underwent a CT scan in September of 2015 which showed a bladder mass and the dome area well without any evidence of lymphadenopathy. On 07/24/2014, he underwent TURBT which he tolerated very well. 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 There is also a small cell component was noted in the posterior dome. He tolerated the procedure well but continues to have intermittent hematuria. He is referred to me for evaluation for neoadjuvant chemotherapy.  Clinically, he is otherwise asymptomatic. He does not report any back pain or shoulder pain or flank pain. He does not report any constitutional symptoms her weight loss or appetite changes. He does report fatigue and tiredness but still mobile at this time using a cane. He does not report any headaches, blurred vision, double vision or syncope. He does not report any chest pain, palpitation or leg edema. He does not report any cough, shortness of breath or hemoptysis. He does not report any nausea, vomiting, constipation or diarrhea. He does not report any skeletal complaints. Rest of his review of systems unremarkable.   Past Medical History  Diagnosis Date  . Bladder neoplasm   . Hypertension   . History of TIA (transient ischemic attack)     2013--  NO RESIDUAL  . GERD (gastroesophageal reflux disease)   . Lower urinary tract symptoms (LUTS)   :  Past Surgical History  Procedure Laterality Date  . Cholecystectomy open  AGE 40   . Negative sleep study  2013  . Transurethral resection of bladder tumor N/A 07/24/2014    Procedure: TRANSURETHRAL RESECTION OF BLADDER TUMOR (TURBT) GREATER THAN 5 CMS;  Surgeon: Festus Aloe, MD;  Location: [facility];  Service: Urology;  Laterality: N/A;  :  Current Outpatient Prescriptions  Medication Sig Dispense Refill  . Cholecalciferol (VITAMIN D3) 1000 UNITS CAPS Take 3 capsules by mouth daily.      Marland Kitchen losartan-hydrochlorothiazide (HYZAAR) 100-25 MG per tablet Take 1 tablet by mouth every evening.      . Multiple Vitamins-Minerals (MULTIVITAMIN ADULTS 50+ PO) Take 1 tablet by mouth daily.      Marland Kitchen oxybutynin (DITROPAN) 5 MG tablet Take 1 tablet (5 mg total) by mouth every 8 (eight) hours as needed for bladder spasms.  30 tablet  0  . pantoprazole (PROTONIX) 40 MG tablet Take 40 mg by mouth every evening.      . sertraline (ZOLOFT) 100 MG tablet Take 100 mg by mouth every evening.      . vitamin B-12 (CYANOCOBALAMIN) 1000 MCG tablet Take 1,000 mcg by mouth daily.      . vitamin C (ASCORBIC ACID) 500 MG tablet Take 500 mg by mouth daily.      Marland Kitchen aspirin EC 81 MG tablet Take 81 mg by mouth daily.      . prochlorperazine (COMPAZINE) 10 MG tablet Take 1 tablet (10 mg total) by mouth every 6 (six) hours as needed for nausea or vomiting.  30 tablet  0   No current  facility-administered medications for this visit.     Allergies  Allergen Reactions  . Latex Other (See Comments)    BLISTERING/ BRUISEING  . Tagamet [Cimetidine] Hives  :  No family history on file.:  History   Social History  . Marital Status: Married    Spouse Name: N/A    Number of Children: N/A  . Years of Education: N/A   Occupational History  . Not on file.   Social History Main Topics  . Smoking status: Current Every Day Smoker -- 0.50 packs/day for 50 years    Types: Cigarettes  . Smokeless tobacco: Never Used  . Alcohol Use: 4.2 oz/week    7 Cans of beer per week     Comment:  [employer]  . Drug Use: No  . Sexual Activity: Not on file   Other Topics Concern  . Not on file   Social History Narrative  . No narrative on file  :  Pertinent items are noted in HPI.  Exam: ECOG 1 Blood pressure 155/69, pulse 74, temperature 97.7 F (36.5 C), temperature source Oral, resp. rate 18, height 5\' 10"  (1.778 m), weight 189 lb 6.4 oz (85.911 kg). General appearance: alert and cooperative Head: Normocephalic, without obvious abnormality Neck: no adenopathy Back: negative Resp: clear to auscultation bilaterally Chest wall: no tenderness Cardio: regular rate and rhythm, S1, S2 normal, no murmur, click, rub or gallop GI: soft, non-tender; bowel sounds normal; no masses,  no organomegaly Extremities: extremities normal, atraumatic, no cyanosis or edema Pulses: 2+ and symmetric Skin: Skin color, texture, turgor normal. No rashes or lesions Lymph nodes: Cervical, supraclavicular, and axillary nodes normal.    Assessment and Plan:    68 year old with the following issues:  1. 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 The patient is a status post transurethral resection of the bladder tumor on 07/24/2014 with the pathology revealed high-grade urothelial carcinoma with at least one portion involvement with small cell component. The clinical staging is T3 N0.  The natural course of this disease was discussed with the patient extensively. I agree with Dr. Junious Silk at this point that he will require a radical cystectomy and preferably neoadjuvant chemotherapy. The logistics of administration of a cisplatin based chemotherapy was discussed. Complications that included nausea, vomiting, myelosuppression, neutropenia, neutropenic sepsis, renal toxicity, peripheral neuropathy were discussed. Although he is a marginal candidate for chemotherapy, I think it's reasonable to try especially with his  aggressive histology. Different regimens of chemotherapy have been utilized including cisplatin with etoposide especially in small cell histology is.cisplatin and Gemzar would be a reasonable regimen with fairly reasonable activity. After discussing the risks and benefits he is agreeable to proceed after chemotherapy education class. He will likely receive cisplatin at a reduced dose of 50 mg per meter squared on day 1 with Gemzar at 1000 mg per meter square on day 1 and Gemzar on day 8 of a 21 day cycle. I anticipate starting on 08/16/2014 for a total of 3 cycles.  2. IV access: I discussed with him the possible need for a Port-A-Cath was discussed. Complications that included infection, bleeding or thrombosis were discussed and he declined for the time being.  3. Anti-emetics: Prescription for Compazine was called in today.  4. Renal insufficiency: His baseline creatinine is normal and will continue to follow that closely on cis-platinum.  All questions were answered today.

## 2014-08-13 ENCOUNTER — Telehealth: Payer: Self-pay | Admitting: *Deleted

## 2014-08-13 NOTE — Telephone Encounter (Signed)
Per staff message and POF I have scheduled appts. Advised scheduler of appts. JMW  

## 2014-08-14 ENCOUNTER — Other Ambulatory Visit: Payer: Medicare HMO

## 2014-08-14 ENCOUNTER — Telehealth: Payer: Self-pay | Admitting: Oncology

## 2014-08-14 ENCOUNTER — Encounter: Payer: Self-pay | Admitting: *Deleted

## 2014-08-14 NOTE — Telephone Encounter (Signed)
Called and s/w pt's wife confirming apt, sent updated sch to Chemo Edu class for pt to p/u today..... KJ

## 2014-08-15 ENCOUNTER — Telehealth: Payer: Self-pay | Admitting: *Deleted

## 2014-08-15 NOTE — Telephone Encounter (Signed)
Per staff message and POF I have scheduled appts. Advised scheduler of appts. JMW  

## 2014-08-16 ENCOUNTER — Other Ambulatory Visit: Payer: Medicare HMO

## 2014-08-17 ENCOUNTER — Ambulatory Visit (HOSPITAL_BASED_OUTPATIENT_CLINIC_OR_DEPARTMENT_OTHER): Payer: Medicare HMO

## 2014-08-17 ENCOUNTER — Telehealth: Payer: Self-pay | Admitting: *Deleted

## 2014-08-17 ENCOUNTER — Other Ambulatory Visit (HOSPITAL_BASED_OUTPATIENT_CLINIC_OR_DEPARTMENT_OTHER): Payer: Medicare HMO

## 2014-08-17 VITALS — BP 154/77 | HR 68 | Temp 97.9°F | Resp 19

## 2014-08-17 DIAGNOSIS — C678 Malignant neoplasm of overlapping sites of bladder: Secondary | ICD-10-CM

## 2014-08-17 DIAGNOSIS — Z5111 Encounter for antineoplastic chemotherapy: Secondary | ICD-10-CM

## 2014-08-17 DIAGNOSIS — C670 Malignant neoplasm of trigone of bladder: Secondary | ICD-10-CM

## 2014-08-17 LAB — COMPREHENSIVE METABOLIC PANEL (CC13)
ALK PHOS: 59 U/L (ref 40–150)
ALT: 12 U/L (ref 0–55)
AST: 13 U/L (ref 5–34)
Albumin: 3.7 g/dL (ref 3.5–5.0)
Anion Gap: 9 mEq/L (ref 3–11)
BUN: 13.9 mg/dL (ref 7.0–26.0)
CO2: 26 mEq/L (ref 22–29)
Calcium: 9.6 mg/dL (ref 8.4–10.4)
Chloride: 97 mEq/L — ABNORMAL LOW (ref 98–109)
Creatinine: 1.1 mg/dL (ref 0.7–1.3)
GLUCOSE: 98 mg/dL (ref 70–140)
Potassium: 4.7 mEq/L (ref 3.5–5.1)
SODIUM: 133 meq/L — AB (ref 136–145)
TOTAL PROTEIN: 6.9 g/dL (ref 6.4–8.3)
Total Bilirubin: 0.29 mg/dL (ref 0.20–1.20)

## 2014-08-17 LAB — CBC WITH DIFFERENTIAL/PLATELET
BASO%: 0.8 % (ref 0.0–2.0)
Basophils Absolute: 0.1 10*3/uL (ref 0.0–0.1)
EOS ABS: 0.3 10*3/uL (ref 0.0–0.5)
EOS%: 4.8 % (ref 0.0–7.0)
HCT: 40.2 % (ref 38.4–49.9)
HGB: 13.3 g/dL (ref 13.0–17.1)
LYMPH%: 29.2 % (ref 14.0–49.0)
MCH: 29.3 pg (ref 27.2–33.4)
MCHC: 33.2 g/dL (ref 32.0–36.0)
MCV: 88.4 fL (ref 79.3–98.0)
MONO#: 0.8 10*3/uL (ref 0.1–0.9)
MONO%: 10.8 % (ref 0.0–14.0)
NEUT%: 54.4 % (ref 39.0–75.0)
NEUTROS ABS: 3.8 10*3/uL (ref 1.5–6.5)
PLATELETS: 294 10*3/uL (ref 140–400)
RBC: 4.55 10*6/uL (ref 4.20–5.82)
RDW: 12.7 % (ref 11.0–14.6)
WBC: 7 10*3/uL (ref 4.0–10.3)
lymph#: 2 10*3/uL (ref 0.9–3.3)

## 2014-08-17 MED ORDER — SODIUM CHLORIDE 0.9 % IV SOLN: 48.5000 mg/m2 | Freq: Once | INTRAVENOUS | Status: DC

## 2014-08-17 MED ORDER — DEXAMETHASONE SODIUM PHOSPHATE 20 MG/5ML IJ SOLN
INTRAMUSCULAR | Status: AC
  Filled 2014-08-17: qty 5

## 2014-08-17 MED ORDER — DEXAMETHASONE SODIUM PHOSPHATE 20 MG/5ML IJ SOLN
12.0000 mg | Freq: Once | INTRAMUSCULAR | Status: AC
  Administered 2014-08-17: 12 mg via INTRAVENOUS

## 2014-08-17 MED ORDER — SODIUM CHLORIDE 0.9 % IV SOLN
2000.0000 mg | Freq: Once | INTRAVENOUS | Status: AC
  Administered 2014-08-17: 2000 mg via INTRAVENOUS
  Filled 2014-08-17: qty 52.6

## 2014-08-17 MED ORDER — SODIUM CHLORIDE 0.9 % IV SOLN
150.0000 mg | Freq: Once | INTRAVENOUS | Status: AC
  Administered 2014-08-17: 150 mg via INTRAVENOUS
  Filled 2014-08-17: qty 5

## 2014-08-17 MED ORDER — SODIUM CHLORIDE 0.9 % IV SOLN
Freq: Once | INTRAVENOUS | Status: AC
  Administered 2014-08-17: 09:00:00 via INTRAVENOUS

## 2014-08-17 MED ORDER — PALONOSETRON HCL INJECTION 0.25 MG/5ML
0.2500 mg | Freq: Once | INTRAVENOUS | Status: AC
  Administered 2014-08-17: 0.25 mg via INTRAVENOUS

## 2014-08-17 MED ORDER — PALONOSETRON HCL INJECTION 0.25 MG/5ML
INTRAVENOUS | Status: AC
  Filled 2014-08-17: qty 5

## 2014-08-17 MED ORDER — SODIUM CHLORIDE 0.9 % IV SOLN
100.0000 mg | Freq: Once | INTRAVENOUS | Status: AC
  Administered 2014-08-17: 100 mg via INTRAVENOUS
  Filled 2014-08-17: qty 100

## 2014-08-17 MED ORDER — POTASSIUM CHLORIDE 2 MEQ/ML IV SOLN
Freq: Once | INTRAVENOUS | Status: AC
  Administered 2014-08-17: 09:00:00 via INTRAVENOUS
  Filled 2014-08-17: qty 10

## 2014-08-17 NOTE — Patient Instructions (Signed)
[facility] Discharge Instructions for Patients Receiving Chemotherapy  Today you received the following chemotherapy agents Gemzar/Cisplatin To help prevent nausea and vomiting after your treatment, we encourage you to take your nausea medication as prescribed.  If you develop nausea and vomiting that is not controlled by your nausea medication, call the clinic.   BELOW ARE SYMPTOMS THAT SHOULD BE REPORTED IMMEDIATELY:  *FEVER GREATER THAN 100.5 F  *CHILLS WITH OR WITHOUT FEVER  NAUSEA AND VOMITING THAT IS NOT CONTROLLED WITH YOUR NAUSEA MEDICATION  *UNUSUAL SHORTNESS OF BREATH  *UNUSUAL BRUISING OR BLEEDING  TENDERNESS IN MOUTH AND THROAT WITH OR WITHOUT PRESENCE OF ULCERS  *URINARY PROBLEMS  *BOWEL PROBLEMS  UNUSUAL RASH Items with * indicate a potential emergency and should be followed up as soon as possible.  Feel free to call the clinic you have any questions or concerns. The clinic phone number is [phone].   Gemcitabine injection (Gemzar) What is this medicine? GEMCITABINE (jem SIT a been) is a chemotherapy drug. This medicine is used to treat many types of cancer like breast cancer, lung cancer, pancreatic cancer, and ovarian cancer. This medicine may be used for other purposes; ask your health care provider or pharmacist if you have questions. COMMON BRAND NAME(S): Gemzar What should I tell my health care provider before I take this medicine? They need to know if you have any of these conditions: -blood disorders -infection -kidney disease -liver disease -recent or ongoing radiation therapy -an unusual or allergic reaction to gemcitabine, other chemotherapy, other medicines, foods, dyes, or preservatives -pregnant or trying to get pregnant -breast-feeding How should I use this medicine? This drug is given as an infusion into a vein. It is administered in a hospital or clinic by a specially trained health care professional. Talk to your  pediatrician regarding the use of this medicine in children. Special care may be needed. Overdosage: If you think you have taken too much of this medicine contact a poison control center or emergency room at once. NOTE: This medicine is only for you. Do not share this medicine with others. What if I miss a dose? It is important not to miss your dose. Call your doctor or health care professional if you are unable to keep an appointment. What may interact with this medicine? -medicines to increase blood counts like filgrastim, pegfilgrastim, sargramostim -some other chemotherapy drugs like cisplatin -vaccines Talk to your doctor or health care professional before taking any of these medicines: -acetaminophen -aspirin -ibuprofen -ketoprofen -naproxen This list may not describe all possible interactions. Give your health care provider a list of all the medicines, herbs, non-prescription drugs, or dietary supplements you use. Also tell them if you smoke, drink alcohol, or use illegal drugs. Some items may interact with your medicine. What should I watch for while using this medicine? Visit your doctor for checks on your progress. This drug may make you feel generally unwell. This is not uncommon, as chemotherapy can affect healthy cells as well as cancer cells. Report any side effects. Continue your course of treatment even though you feel ill unless your doctor tells you to stop. In some cases, you may be given additional medicines to help with side effects. Follow all directions for their use. Call your doctor or health care professional for advice if you get a fever, chills or sore throat, or other symptoms of a cold or flu. Do not treat yourself. This drug decreases your body's ability to fight infections. Try to avoid  being around people who are sick. This medicine may increase your risk to bruise or bleed. Call your doctor or health care professional if you notice any unusual bleeding. Be  careful brushing and flossing your teeth or using a toothpick because you may get an infection or bleed more easily. If you have any dental work done, tell your dentist you are receiving this medicine. Avoid taking products that contain aspirin, acetaminophen, ibuprofen, naproxen, or ketoprofen unless instructed by your doctor. These medicines may hide a fever. Women should inform their doctor if they wish to become pregnant or think they might be pregnant. There is a potential for serious side effects to an unborn child. Talk to your health care professional or pharmacist for more information. Do not breast-feed an infant while taking this medicine. What side effects may I notice from receiving this medicine? Side effects that you should report to your doctor or health care professional as soon as possible: -allergic reactions like skin rash, itching or hives, swelling of the face, lips, or tongue -low blood counts - this medicine may decrease the number of white blood cells, red blood cells and platelets. You may be at increased risk for infections and bleeding. -signs of infection - fever or chills, cough, sore throat, pain or difficulty passing urine -signs of decreased platelets or bleeding - bruising, pinpoint red spots on the skin, black, tarry stools, blood in the urine -signs of decreased red blood cells - unusually weak or tired, fainting spells, lightheadedness -breathing problems -chest pain -mouth sores -nausea and vomiting -pain, swelling, redness at site where injected -pain, tingling, numbness in the hands or feet -stomach pain -swelling of ankles, feet, hands -unusual bleeding Side effects that usually do not require medical attention (report to your doctor or health care professional if they continue or are bothersome): -constipation -diarrhea -hair loss -loss of appetite -stomach upset This list may not describe all possible side effects. Call your doctor for medical  advice about side effects. You may report side effects to FDA at 1-800-FDA-1088. Where should I keep my medicine? This drug is given in a hospital or clinic and will not be stored at home. NOTE: This sheet is a summary. It may not cover all possible information. If you have questions about this medicine, talk to your doctor, pharmacist, or health care provider.  2015, Elsevier/Gold Standard. (2008-02-14 18:45:54)  Cisplatin injection What is this medicine? CISPLATIN (SIS pla tin) is a chemotherapy drug. It targets fast dividing cells, like cancer cells, and causes these cells to die. This medicine is used to treat many types of cancer like bladder, ovarian, and testicular cancers. This medicine may be used for other purposes; ask your health care provider or pharmacist if you have questions. COMMON BRAND NAME(S): Platinol, Platinol -AQ What should I tell my health care provider before I take this medicine? They need to know if you have any of these conditions: -blood disorders -hearing problems -kidney disease -recent or ongoing radiation therapy -an unusual or allergic reaction to cisplatin, carboplatin, other chemotherapy, other medicines, foods, dyes, or preservatives -pregnant or trying to get pregnant -breast-feeding How should I use this medicine? This drug is given as an infusion into a vein. It is administered in a hospital or clinic by a specially trained health care professional. Talk to your pediatrician regarding the use of this medicine in children. Special care may be needed. Overdosage: If you think you have taken too much of this medicine contact a poison control  center or emergency room at once. NOTE: This medicine is only for you. Do not share this medicine with others. What if I miss a dose? It is important not to miss a dose. Call your doctor or health care professional if you are unable to keep an appointment. What may interact with this  medicine? -dofetilide -foscarnet -medicines for seizures -medicines to increase blood counts like filgrastim, pegfilgrastim, sargramostim -probenecid -pyridoxine used with altretamine -rituximab -some antibiotics like amikacin, gentamicin, neomycin, polymyxin B, streptomycin, tobramycin -sulfinpyrazone -vaccines -zalcitabine Talk to your doctor or health care professional before taking any of these medicines: -acetaminophen -aspirin -ibuprofen -ketoprofen -naproxen This list may not describe all possible interactions. Give your health care provider a list of all the medicines, herbs, non-prescription drugs, or dietary supplements you use. Also tell them if you smoke, drink alcohol, or use illegal drugs. Some items may interact with your medicine. What should I watch for while using this medicine? Your condition will be monitored carefully while you are receiving this medicine. You will need important blood work done while you are taking this medicine. This drug may make you feel generally unwell. This is not uncommon, as chemotherapy can affect healthy cells as well as cancer cells. Report any side effects. Continue your course of treatment even though you feel ill unless your doctor tells you to stop. In some cases, you may be given additional medicines to help with side effects. Follow all directions for their use. Call your doctor or health care professional for advice if you get a fever, chills or sore throat, or other symptoms of a cold or flu. Do not treat yourself. This drug decreases your body's ability to fight infections. Try to avoid being around people who are sick. This medicine may increase your risk to bruise or bleed. Call your doctor or health care professional if you notice any unusual bleeding. Be careful brushing and flossing your teeth or using a toothpick because you may get an infection or bleed more easily. If you have any dental work done, tell your dentist you are  receiving this medicine. Avoid taking products that contain aspirin, acetaminophen, ibuprofen, naproxen, or ketoprofen unless instructed by your doctor. These medicines may hide a fever. Do not become pregnant while taking this medicine. Women should inform their doctor if they wish to become pregnant or think they might be pregnant. There is a potential for serious side effects to an unborn child. Talk to your health care professional or pharmacist for more information. Do not breast-feed an infant while taking this medicine. Drink fluids as directed while you are taking this medicine. This will help protect your kidneys. Call your doctor or health care professional if you get diarrhea. Do not treat yourself. What side effects may I notice from receiving this medicine? Side effects that you should report to your doctor or health care professional as soon as possible: -allergic reactions like skin rash, itching or hives, swelling of the face, lips, or tongue -signs of infection - fever or chills, cough, sore throat, pain or difficulty passing urine -signs of decreased platelets or bleeding - bruising, pinpoint red spots on the skin, black, tarry stools, nosebleeds -signs of decreased red blood cells - unusually weak or tired, fainting spells, lightheadedness -breathing problems -changes in hearing -gout pain -low blood counts - This drug may decrease the number of white blood cells, red blood cells and platelets. You may be at increased risk for infections and bleeding. -nausea and vomiting -pain, swelling,  redness or irritation at the injection site -pain, tingling, numbness in the hands or feet -problems with balance, movement -trouble passing urine or change in the amount of urine Side effects that usually do not require medical attention (report to your doctor or health care professional if they continue or are bothersome): -changes in vision -loss of appetite -metallic taste in the mouth  or changes in taste This list may not describe all possible side effects. Call your doctor for medical advice about side effects. You may report side effects to FDA at 1-800-FDA-1088. Where should I keep my medicine? This drug is given in a hospital or clinic and will not be stored at home. NOTE: This sheet is a summary. It may not cover all possible information. If you have questions about this medicine, talk to your doctor, pharmacist, or health care provider.  2015, Elsevier/Gold Standard. (2008-01-10 14:40:54)

## 2014-08-17 NOTE — Telephone Encounter (Signed)
Per staff message and POF I have scheduled appts. Advised scheduler of appts. JMW  

## 2014-08-20 ENCOUNTER — Telehealth: Payer: Self-pay | Admitting: *Deleted

## 2014-08-20 NOTE — Telephone Encounter (Signed)
-----   Message from Perlie Gold sent at 08/17/2014  3:04 PM EDT ----- Regarding: Chemo Follow up call First time Gemzar/Cisplatin. Dr. Alen Blew patient. Please call.  Thanks, Barnetta Chapel

## 2014-08-20 NOTE — Telephone Encounter (Signed)
Donald Decker for chemotherapy F/U.  Spoke with spouse Donald Decker.  He is resting and H.O.H. So Donald Decker offered to talk with me.  Patient is doing well.  Reports nausea, took anti-emetic and no vomiting.  Denies any new side effects or symptoms.  Bowel and bladder is functioning well.  Eating and drinking well and I instructed to drink 64 oz minimum daily or at least the day before, of and after treatment.  Denies questions at this time and encouraged to call if needed.  Reviewed how to call after hours in the case of an emergency.

## 2014-08-23 ENCOUNTER — Telehealth: Payer: Self-pay | Admitting: *Deleted

## 2014-08-23 ENCOUNTER — Other Ambulatory Visit (HOSPITAL_BASED_OUTPATIENT_CLINIC_OR_DEPARTMENT_OTHER): Payer: Medicare HMO

## 2014-08-23 ENCOUNTER — Ambulatory Visit (HOSPITAL_BASED_OUTPATIENT_CLINIC_OR_DEPARTMENT_OTHER): Payer: Medicare HMO | Admitting: Oncology

## 2014-08-23 ENCOUNTER — Telehealth: Payer: Self-pay | Admitting: Oncology

## 2014-08-23 VITALS — BP 154/62 | HR 86 | Temp 98.6°F | Resp 19 | Ht 70.0 in | Wt 185.9 lb

## 2014-08-23 DIAGNOSIS — C677 Malignant neoplasm of urachus: Secondary | ICD-10-CM

## 2014-08-23 DIAGNOSIS — C670 Malignant neoplasm of trigone of bladder: Secondary | ICD-10-CM

## 2014-08-23 DIAGNOSIS — N289 Disorder of kidney and ureter, unspecified: Secondary | ICD-10-CM

## 2014-08-23 LAB — CBC WITH DIFFERENTIAL/PLATELET
BASO%: 0.5 % (ref 0.0–2.0)
Basophils Absolute: 0 10*3/uL (ref 0.0–0.1)
EOS%: 0.5 % (ref 0.0–7.0)
Eosinophils Absolute: 0 10*3/uL (ref 0.0–0.5)
HEMATOCRIT: 40.7 % (ref 38.4–49.9)
HGB: 13.7 g/dL (ref 13.0–17.1)
LYMPH%: 25.6 % (ref 14.0–49.0)
MCH: 29.6 pg (ref 27.2–33.4)
MCHC: 33.8 g/dL (ref 32.0–36.0)
MCV: 87.7 fL (ref 79.3–98.0)
MONO#: 0.1 10*3/uL (ref 0.1–0.9)
MONO%: 1.4 % (ref 0.0–14.0)
NEUT#: 4.1 10*3/uL (ref 1.5–6.5)
NEUT%: 72 % (ref 39.0–75.0)
Platelets: 181 10*3/uL (ref 140–400)
RBC: 4.63 10*6/uL (ref 4.20–5.82)
RDW: 12.8 % (ref 11.0–14.6)
WBC: 5.6 10*3/uL (ref 4.0–10.3)
lymph#: 1.4 10*3/uL (ref 0.9–3.3)

## 2014-08-23 LAB — COMPREHENSIVE METABOLIC PANEL (CC13)
ALK PHOS: 60 U/L (ref 40–150)
ALT: 42 U/L (ref 0–55)
AST: 24 U/L (ref 5–34)
Albumin: 4 g/dL (ref 3.5–5.0)
Anion Gap: 8 mEq/L (ref 3–11)
BILIRUBIN TOTAL: 0.55 mg/dL (ref 0.20–1.20)
BUN: 22.3 mg/dL (ref 7.0–26.0)
CO2: 26 meq/L (ref 22–29)
CREATININE: 1.1 mg/dL (ref 0.7–1.3)
Calcium: 9.7 mg/dL (ref 8.4–10.4)
Chloride: 92 mEq/L — ABNORMAL LOW (ref 98–109)
Glucose: 95 mg/dl (ref 70–140)
Potassium: 4.9 mEq/L (ref 3.5–5.1)
Sodium: 126 mEq/L — ABNORMAL LOW (ref 136–145)
Total Protein: 7.5 g/dL (ref 6.4–8.3)

## 2014-08-23 NOTE — Progress Notes (Signed)
Hematology and Oncology Follow Up Visit  Donald Decker [id number] Mar 15, 1946 68 y.o. 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 He had a T3 N0 disease.   Prior Therapy: On 07/24/2014, he underwent TURBT which he tolerated very well. 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 There is also a small cell component was noted in the posterior dome  Current therapy: neoadjuvant systemic chemotherapy with cisplatin and Gemzar cycle one given on 08/17/2014 today is day 8 of cycle 1. Plan for total of 3 cycles.  Interim History:  Donald Decker presents today for a follow-up visit. Since the last visit, he tolerated day 1 cycle 1 of chemotherapy without any complications. He did not report any nausea, vomiting or peripheral neuropathy. He has not reported any major decline in his energy or performance status. He does not report any back pain or shoulder pain or flank pain. He does not report any constitutional symptoms her weight loss or appetite changes. He does report fatigue and tiredness but still mobile at this time using a cane. He does not report any headaches, blurred vision, double vision or syncope. He does not report any chest pain, palpitation or leg edema. He does not report any cough, shortness of breath or hemoptysis. He does not report any nausea, vomiting, constipation or diarrhea. He does not report any skeletal complaints. Rest of his review of systems unremarkable.  Medications: I have reviewed the patient's current medications.  Current Outpatient Prescriptions  Medication Sig Dispense Refill  . aspirin EC 81 MG tablet Take 81 mg by mouth daily.    . Cholecalciferol (VITAMIN D3) 1000 UNITS CAPS Take 3 capsules by mouth daily.    Marland Kitchen losartan-hydrochlorothiazide (HYZAAR) 100-25 MG per tablet Take 1 tablet  by mouth every evening.    . Multiple Vitamins-Minerals (MULTIVITAMIN ADULTS 50+ PO) Take 1 tablet by mouth daily.    Marland Kitchen oxybutynin (DITROPAN) 5 MG tablet Take 1 tablet (5 mg total) by mouth every 8 (eight) hours as needed for bladder spasms. 30 tablet 0  . pantoprazole (PROTONIX) 40 MG tablet Take 40 mg by mouth every evening.    . prochlorperazine (COMPAZINE) 10 MG tablet Take 1 tablet (10 mg total) by mouth every 6 (six) hours as needed for nausea or vomiting. 30 tablet 0  . sertraline (ZOLOFT) 100 MG tablet Take 100 mg by mouth every evening.    . vitamin B-12 (CYANOCOBALAMIN) 1000 MCG tablet Take 1,000 mcg by mouth daily.    . vitamin C (ASCORBIC ACID) 500 MG tablet Take 500 mg by mouth daily.     No current facility-administered medications for this visit.     Allergies:  Allergies  Allergen Reactions  . Latex Other (See Comments)    BLISTERING/ BRUISEING  . Tagamet [Cimetidine] Hives    Past Medical History, Surgical history, Social history, and Family History were reviewed and updated.  Physical Exam: Blood pressure 154/62, pulse 86, temperature 98.6 F (37 C), temperature source Oral, resp. rate 19, height 5\' 10"  (1.778 m), weight 185 lb 14.4 oz (84.324 kg), SpO2 100 %. ECOG: 1 General appearance: alert and cooperative Head: Normocephalic, without obvious abnormality Neck: no adenopathy Lymph nodes: Cervical, supraclavicular, and axillary nodes normal. Heart:regular rate and rhythm, S1, S2 normal, no murmur, click, rub or gallop Lung:chest clear, no wheezing, rales, normal symmetric air entry Abdomin: soft, non-tender, without masses or  organomegaly EXT:no erythema, induration, or nodules   Lab Results: Lab Results  Component Value Date   WBC 5.6 08/23/2014   HGB 13.7 08/23/2014   HCT 40.7 08/23/2014   MCV 87.7 08/23/2014   PLT 181 08/23/2014     Chemistry      Component Value Date/Time   NA 133* 08/17/2014 0757   NA 127* 07/24/2014 0635   K 4.7 08/17/2014  0757   K 4.2 07/24/2014 0635   CO2 26 08/17/2014 0757   BUN 13.9 08/17/2014 0757   CREATININE 1.1 08/17/2014 0757      Component Value Date/Time   CALCIUM 9.6 08/17/2014 0757   ALKPHOS 59 08/17/2014 0757   AST 13 08/17/2014 0757   ALT 12 08/17/2014 0757   BILITOT 0.29 08/17/2014 0757       Impression and Plan:   68 year old with the following issues:  1. 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 The patient is a status post transurethral resection of the bladder tumor on 07/24/2014 with the pathology revealed high-grade urothelial carcinoma with at least one portion involvement with small cell component. The clinical staging is T3 N0. He is currently receiving neoadjuvant chemotherapy with cisplatin and Gemzar. He is ready to proceed with day 8 of cycle 1 after reviewing his laboratory data.  2. IV access: I discussed with him the possible need for a Port-A-Cath was discussed again. Complications that included infection, bleeding or thrombosis were discussed and he declined for the time being.  3. Anti-emetics: Prescription for Compazine was called in previously.  4. Renal insufficiency: His baseline creatinine is normal and will continue to follow that closely on cis-platinum.   UYEBXI,DHWYS, MD 11/5/20151:52 PM

## 2014-08-23 NOTE — Telephone Encounter (Signed)
I have adjsuted 11/25 appt

## 2014-08-23 NOTE — Telephone Encounter (Signed)
Gave avs & cal. Sent mess to move tx to follow appt 11/25.

## 2014-08-24 ENCOUNTER — Other Ambulatory Visit: Payer: Self-pay | Admitting: *Deleted

## 2014-08-24 ENCOUNTER — Ambulatory Visit (HOSPITAL_BASED_OUTPATIENT_CLINIC_OR_DEPARTMENT_OTHER): Payer: Medicare HMO

## 2014-08-24 ENCOUNTER — Encounter: Payer: Self-pay | Admitting: *Deleted

## 2014-08-24 DIAGNOSIS — C670 Malignant neoplasm of trigone of bladder: Secondary | ICD-10-CM

## 2014-08-24 DIAGNOSIS — C677 Malignant neoplasm of urachus: Secondary | ICD-10-CM

## 2014-08-24 DIAGNOSIS — Z5111 Encounter for antineoplastic chemotherapy: Secondary | ICD-10-CM

## 2014-08-24 MED ORDER — SODIUM CHLORIDE 0.9 % IV SOLN
Freq: Once | INTRAVENOUS | Status: AC
  Administered 2014-08-24: 14:00:00 via INTRAVENOUS

## 2014-08-24 MED ORDER — PROCHLORPERAZINE MALEATE 10 MG PO TABS
ORAL_TABLET | ORAL | Status: AC
  Filled 2014-08-24: qty 1

## 2014-08-24 MED ORDER — PROCHLORPERAZINE MALEATE 10 MG PO TABS
10.0000 mg | ORAL_TABLET | Freq: Once | ORAL | Status: AC
  Administered 2014-08-24: 10 mg via ORAL

## 2014-08-24 MED ORDER — SODIUM CHLORIDE 0.9 % IV SOLN
1000.0000 mg/m2 | Freq: Once | INTRAVENOUS | Status: AC
  Administered 2014-08-24: 2052 mg via INTRAVENOUS
  Filled 2014-08-24: qty 53.97

## 2014-08-24 NOTE — Progress Notes (Signed)
[facility]  Clinical Social Work was referred by Therapist, sports  to review and complete healthcare advance directives.  Clinical Social Worker met with patient and wife in [place] office.  The patient designated Calixto Pavel as their primary healthcare agent and Alhassan Everingham as their secondary agent.  Patient also completed healthcare living will.    Clinical Social Worker notarized documents and made copies for patient/family. Clinical Social Worker will send documents to medical records to be scanned into patient's chart. Clinical Social Worker encouraged patient/family to contact with any additional questions or concerns.  Loren Racer, Golf Worker [place]  [facility] Phone: [phone] Fax: [phone]

## 2014-08-24 NOTE — Patient Instructions (Signed)
[facility] Discharge Instructions for Patients Receiving Chemotherapy  Today you received the following chemotherapy agents gemzar  To help prevent nausea and vomiting after your treatment, we encourage you to take your nausea medication as directed.  If you develop nausea and vomiting that is not controlled by your nausea medication, call the clinic.   BELOW ARE SYMPTOMS THAT SHOULD BE REPORTED IMMEDIATELY:  *FEVER GREATER THAN 100.5 F  *CHILLS WITH OR WITHOUT FEVER  NAUSEA AND VOMITING THAT IS NOT CONTROLLED WITH YOUR NAUSEA MEDICATION  *UNUSUAL SHORTNESS OF BREATH  *UNUSUAL BRUISING OR BLEEDING  TENDERNESS IN MOUTH AND THROAT WITH OR WITHOUT PRESENCE OF ULCERS  *URINARY PROBLEMS  *BOWEL PROBLEMS  UNUSUAL RASH Items with * indicate a potential emergency and should be followed up as soon as possible.  Feel free to call the clinic you have any questions or concerns. The clinic phone number is [phone].  

## 2014-08-30 ENCOUNTER — Telehealth: Payer: Self-pay | Admitting: Medical Oncology

## 2014-08-30 ENCOUNTER — Other Ambulatory Visit: Payer: Self-pay | Admitting: Medical Oncology

## 2014-08-30 MED ORDER — OXYBUTYNIN CHLORIDE 5 MG PO TABS: 5.0000 mg | ORAL_TABLET | Freq: Three times a day (TID) | ORAL | Status: DC | PRN

## 2014-08-30 NOTE — Telephone Encounter (Signed)
Ok to refill 

## 2014-08-30 NOTE — Telephone Encounter (Signed)
Wife requesting a refill prescription, for patient, of Oxybutynin 5 mg tablet (for bladder spasms). States original prescription was written by Dr. Junious Silk 10/6. Asking if Dr. Alen Blew would refill this.  Mssg forwarded to MD for review.

## 2014-08-30 NOTE — Telephone Encounter (Signed)
Patient informed of Ditropan refill sent to his requested pharmacy. Patient expressed thanks, no further questions at this time.

## 2014-09-06 ENCOUNTER — Encounter: Payer: Self-pay | Admitting: Physician Assistant

## 2014-09-06 ENCOUNTER — Ambulatory Visit (HOSPITAL_BASED_OUTPATIENT_CLINIC_OR_DEPARTMENT_OTHER): Payer: Medicare HMO | Admitting: Physician Assistant

## 2014-09-06 ENCOUNTER — Other Ambulatory Visit (HOSPITAL_BASED_OUTPATIENT_CLINIC_OR_DEPARTMENT_OTHER): Payer: Medicare HMO

## 2014-09-06 ENCOUNTER — Ambulatory Visit (HOSPITAL_BASED_OUTPATIENT_CLINIC_OR_DEPARTMENT_OTHER): Payer: Medicare HMO

## 2014-09-06 VITALS — BP 147/89 | HR 93 | Temp 98.0°F | Resp 18 | Ht 70.0 in | Wt 188.8 lb

## 2014-09-06 DIAGNOSIS — C670 Malignant neoplasm of trigone of bladder: Secondary | ICD-10-CM

## 2014-09-06 DIAGNOSIS — C677 Malignant neoplasm of urachus: Secondary | ICD-10-CM

## 2014-09-06 DIAGNOSIS — Z5111 Encounter for antineoplastic chemotherapy: Secondary | ICD-10-CM

## 2014-09-06 DIAGNOSIS — E860 Dehydration: Secondary | ICD-10-CM

## 2014-09-06 LAB — CBC WITH DIFFERENTIAL/PLATELET
BASO%: 0.4 % (ref 0.0–2.0)
BASOS ABS: 0 10*3/uL (ref 0.0–0.1)
EOS%: 1.7 % (ref 0.0–7.0)
Eosinophils Absolute: 0.1 10*3/uL (ref 0.0–0.5)
HEMATOCRIT: 34.9 % — AB (ref 38.4–49.9)
HEMOGLOBIN: 11.8 g/dL — AB (ref 13.0–17.1)
LYMPH%: 31.8 % (ref 14.0–49.0)
MCH: 29.4 pg (ref 27.2–33.4)
MCHC: 33.6 g/dL (ref 32.0–36.0)
MCV: 87.4 fL (ref 79.3–98.0)
MONO#: 0.5 10*3/uL (ref 0.1–0.9)
MONO%: 15.6 % — ABNORMAL HIGH (ref 0.0–14.0)
NEUT#: 1.6 10*3/uL (ref 1.5–6.5)
NEUT%: 50.5 % (ref 39.0–75.0)
PLATELETS: 385 10*3/uL (ref 140–400)
RBC: 4 10*6/uL — ABNORMAL LOW (ref 4.20–5.82)
RDW: 12.5 % (ref 11.0–14.6)
WBC: 3.2 10*3/uL — AB (ref 4.0–10.3)
lymph#: 1 10*3/uL (ref 0.9–3.3)

## 2014-09-06 LAB — COMPREHENSIVE METABOLIC PANEL (CC13)
ALT: 12 U/L (ref 0–55)
ANION GAP: 9 meq/L (ref 3–11)
AST: 14 U/L (ref 5–34)
Albumin: 3.8 g/dL (ref 3.5–5.0)
Alkaline Phosphatase: 65 U/L (ref 40–150)
BUN: 9.2 mg/dL (ref 7.0–26.0)
CALCIUM: 9.4 mg/dL (ref 8.4–10.4)
CHLORIDE: 93 meq/L — AB (ref 98–109)
CO2: 26 meq/L (ref 22–29)
Creatinine: 1.1 mg/dL (ref 0.7–1.3)
GLUCOSE: 102 mg/dL (ref 70–140)
Potassium: 4.2 mEq/L (ref 3.5–5.1)
Sodium: 128 mEq/L — ABNORMAL LOW (ref 136–145)
Total Bilirubin: 0.31 mg/dL (ref 0.20–1.20)
Total Protein: 7 g/dL (ref 6.4–8.3)

## 2014-09-06 MED ORDER — SODIUM CHLORIDE 0.9 % IV SOLN
48.5000 mg/m2 | Freq: Once | INTRAVENOUS | Status: AC
  Administered 2014-09-06: 100 mg via INTRAVENOUS
  Filled 2014-09-06: qty 100

## 2014-09-06 MED ORDER — DEXAMETHASONE SODIUM PHOSPHATE 20 MG/5ML IJ SOLN
12.0000 mg | Freq: Once | INTRAMUSCULAR | Status: AC
  Administered 2014-09-06: 12 mg via INTRAVENOUS

## 2014-09-06 MED ORDER — SODIUM CHLORIDE 0.9 % IV SOLN
Freq: Once | INTRAVENOUS | Status: AC
  Administered 2014-09-06: 09:00:00 via INTRAVENOUS

## 2014-09-06 MED ORDER — PALONOSETRON HCL INJECTION 0.25 MG/5ML
0.2500 mg | Freq: Once | INTRAVENOUS | Status: AC
  Administered 2014-09-06: 0.25 mg via INTRAVENOUS

## 2014-09-06 MED ORDER — PALONOSETRON HCL INJECTION 0.25 MG/5ML
INTRAVENOUS | Status: AC
  Filled 2014-09-06: qty 5

## 2014-09-06 MED ORDER — MANNITOL 25 % IV SOLN
Freq: Once | INTRAVENOUS | Status: AC
  Administered 2014-09-06: 10:00:00 via INTRAVENOUS
  Filled 2014-09-06: qty 10

## 2014-09-06 MED ORDER — DEXAMETHASONE SODIUM PHOSPHATE 20 MG/5ML IJ SOLN
INTRAMUSCULAR | Status: AC
  Filled 2014-09-06: qty 5

## 2014-09-06 MED ORDER — SODIUM CHLORIDE 0.9 % IV SOLN
1000.0000 mL | Freq: Once | INTRAVENOUS | Status: AC
  Administered 2014-09-06: 500 mL via INTRAVENOUS

## 2014-09-06 MED ORDER — SODIUM CHLORIDE 0.9 % IV SOLN
150.0000 mg | Freq: Once | INTRAVENOUS | Status: AC
  Administered 2014-09-06: 150 mg via INTRAVENOUS
  Filled 2014-09-06: qty 5

## 2014-09-06 MED ORDER — SODIUM CHLORIDE 0.9 % IV SOLN
500.0000 mL | Freq: Once | INTRAVENOUS | Status: AC
  Administered 2014-09-06: 10:00:00 via INTRAVENOUS

## 2014-09-06 MED ORDER — SODIUM CHLORIDE 0.9 % IV SOLN
1000.0000 mg/m2 | Freq: Once | INTRAVENOUS | Status: AC
  Administered 2014-09-06: 2052 mg via INTRAVENOUS
  Filled 2014-09-06: qty 53.97

## 2014-09-06 NOTE — Patient Instructions (Signed)
[facility] Discharge Instructions for Patients Receiving Chemotherapy  Today you received the following chemotherapy agents Gemzar/Cisplatin To help prevent nausea and vomiting after your treatment, we encourage you to take your nausea medication as prescribed.  If you develop nausea and vomiting that is not controlled by your nausea medication, call the clinic.   BELOW ARE SYMPTOMS THAT SHOULD BE REPORTED IMMEDIATELY:  *FEVER GREATER THAN 100.5 F  *CHILLS WITH OR WITHOUT FEVER  NAUSEA AND VOMITING THAT IS NOT CONTROLLED WITH YOUR NAUSEA MEDICATION  *UNUSUAL SHORTNESS OF BREATH  *UNUSUAL BRUISING OR BLEEDING  TENDERNESS IN MOUTH AND THROAT WITH OR WITHOUT PRESENCE OF ULCERS  *URINARY PROBLEMS  *BOWEL PROBLEMS  UNUSUAL RASH Items with * indicate a potential emergency and should be followed up as soon as possible.  Feel free to call the clinic you have any questions or concerns. The clinic phone number is [phone].

## 2014-09-06 NOTE — Progress Notes (Signed)
Hematology and Oncology Follow Up Visit  Donald Decker [id number] 1945/12/14 68 y.o. 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 He had a T3 N0 disease.   Prior Therapy: On 07/24/2014, he underwent TURBT which he tolerated very well. 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 There is also a small cell component was noted in the posterior dome  Current therapy: neoadjuvant systemic chemotherapy with cisplatin and Gemzar cycle one given on 08/17/2014 today is day 8 of cycle 1. Plan for total of 3 cycles. Status post 1 cycle  Interim History:  Donald Decker presents today for a follow-up visit. She reports decreased energy as well as some weakness in the legs. He does have a history of a stroke approximately 2 years ago. He complains of the vein and that was used for his last chemotherapy had some redness and tenderness after treatment. This has resolved. He is only drinking coffee and tea and no water.  He does not report any nausea, vomiting or peripheral neuropathy. He does not report any back pain or shoulder pain or flank pain. He does not report any constitutional symptoms her weight loss or appetite changes. He does report fatigue and tiredness but still mobile at this time using a cane. He does not report any headaches, blurred vision, double vision or syncope. He does not report any chest pain, palpitation or leg edema. He does not report any cough, shortness of breath or hemoptysis. He does not report any nausea, vomiting, constipation or diarrhea. He does not report any skeletal complaints. Remainder of his review of systems unremarkable.  Medications: I have reviewed the patient's current medications.  Current Outpatient Prescriptions  Medication Sig Dispense Refill  . Cholecalciferol (VITAMIN D3) 1000  UNITS CAPS Take 3 capsules by mouth daily.    . diazepam (VALIUM) 5 MG tablet Take 5 mg by mouth daily.  5  . losartan-hydrochlorothiazide (HYZAAR) 100-25 MG per tablet Take 1 tablet by mouth every evening.    . Multiple Vitamins-Minerals (MULTIVITAMIN ADULTS 50+ PO) Take 1 tablet by mouth daily.    Marland Kitchen oxybutynin (DITROPAN) 5 MG tablet Take 1 tablet (5 mg total) by mouth every 8 (eight) hours as needed for bladder spasms. 30 tablet 0  . pantoprazole (PROTONIX) 40 MG tablet Take 40 mg by mouth every evening.    . prochlorperazine (COMPAZINE) 10 MG tablet Take 1 tablet (10 mg total) by mouth every 6 (six) hours as needed for nausea or vomiting. 30 tablet 0  . sertraline (ZOLOFT) 100 MG tablet Take 100 mg by mouth every evening.    . vitamin B-12 (CYANOCOBALAMIN) 1000 MCG tablet Take 1,000 mcg by mouth daily.    Marland Kitchen aspirin EC 81 MG tablet Take 81 mg by mouth daily.    . vitamin C (ASCORBIC ACID) 500 MG tablet Take 500 mg by mouth daily.     No current facility-administered medications for this visit.   Facility-Administered Medications Ordered in Other Visits  Medication Dose Route Frequency Provider Last Rate Last Dose  . CISplatin (PLATINOL) 100 mg in sodium chloride 0.9 % 250 mL chemo infusion  48.5 mg/m2 (Treatment Plan Actual) Intravenous Once Donald Portela, MD         Allergies:  Allergies  Allergen Reactions  . Latex Other (See Comments)    BLISTERING/ BRUISEING  . Tagamet [Cimetidine] Hives    Past  Medical History, Surgical history, Social history, and Family History were reviewed and updated.  Physical Exam: Blood pressure 147/89, pulse 93, temperature 98 F (36.7 C), temperature source Oral, resp. rate 18, height 5\' 10"  (1.778 m), weight 188 lb 12.8 oz (85.639 kg), SpO2 100 %. ECOG: 1 General appearance: alert and cooperative Head: Normocephalic, without obvious abnormality Neck: no adenopathy Lymph nodes: Cervical, supraclavicular, and axillary nodes normal. Heart:regular  rate and rhythm, S1, S2 normal, no murmur, click, rub or gallop Lung:chest clear, no wheezing, rales, normal symmetric air entry Abdomin: soft, non-tender, without masses or organomegaly EXT:no erythema, induration, or nodules   Lab Results: Lab Results  Component Value Date   WBC 3.2* 09/06/2014   HGB 11.8* 09/06/2014   HCT 34.9* 09/06/2014   MCV 87.4 09/06/2014   PLT 385 09/06/2014     Chemistry      Component Value Date/Time   NA 128* 09/06/2014 0816   NA 127* 07/24/2014 0635   K 4.2 09/06/2014 0816   K 4.2 07/24/2014 0635   CO2 26 09/06/2014 0816   BUN 9.2 09/06/2014 0816   CREATININE 1.1 09/06/2014 0816      Component Value Date/Time   CALCIUM 9.4 09/06/2014 0816   ALKPHOS 65 09/06/2014 0816   AST 14 09/06/2014 0816   ALT 12 09/06/2014 0816   BILITOT 0.31 09/06/2014 0816       Impression and Plan:   68 year old with the following issues:  1. 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 The patient is a status post transurethral resection of the bladder tumor on 07/24/2014 with the pathology revealed high-grade urothelial carcinoma with at least one portion involvement with small cell component. The clinical staging is T3 N0. He is currently receiving neoadjuvant chemotherapy with cisplatin and Gemzar. He is ready to proceed with day 1 of cycle 2 after reviewing his laboratory data.  2. IV access: I discussed with him the possible need for a Port-A-Cath was discussed again. Complications that included infection, bleeding or thrombosis were discussed and he again declined for the time being.  3. Anti-emetics: Continue Compazine as prescribed.  4. Renal insufficiency: His baseline creatinine is normal and will continue to follow that closely on cis-platinum.  5. Follow up: will be in 1 week with day 8 of cycle #2.   Alisse Tuite E, PA-C  11/19/20152:14 PM

## 2014-09-09 NOTE — Patient Instructions (Signed)
Continue labs and chemotherapy as scheduled Follow up on 1 week

## 2014-09-12 ENCOUNTER — Ambulatory Visit (HOSPITAL_BASED_OUTPATIENT_CLINIC_OR_DEPARTMENT_OTHER): Payer: Medicare HMO | Admitting: Oncology

## 2014-09-12 ENCOUNTER — Telehealth: Payer: Self-pay | Admitting: Oncology

## 2014-09-12 ENCOUNTER — Ambulatory Visit: Payer: Medicare HMO

## 2014-09-12 ENCOUNTER — Other Ambulatory Visit (HOSPITAL_BASED_OUTPATIENT_CLINIC_OR_DEPARTMENT_OTHER): Payer: Medicare HMO

## 2014-09-12 ENCOUNTER — Other Ambulatory Visit: Payer: Medicare HMO

## 2014-09-12 VITALS — BP 167/74 | HR 78 | Temp 97.7°F | Resp 18 | Ht 70.0 in | Wt 191.0 lb

## 2014-09-12 DIAGNOSIS — N289 Disorder of kidney and ureter, unspecified: Secondary | ICD-10-CM

## 2014-09-12 DIAGNOSIS — C670 Malignant neoplasm of trigone of bladder: Secondary | ICD-10-CM

## 2014-09-12 LAB — CBC WITH DIFFERENTIAL/PLATELET
BASO%: 1.5 % (ref 0.0–2.0)
Basophils Absolute: 0 10*3/uL (ref 0.0–0.1)
EOS ABS: 0 10*3/uL (ref 0.0–0.5)
EOS%: 0.5 % (ref 0.0–7.0)
HCT: 35.3 % — ABNORMAL LOW (ref 38.4–49.9)
HEMOGLOBIN: 11.9 g/dL — AB (ref 13.0–17.1)
LYMPH#: 1 10*3/uL (ref 0.9–3.3)
LYMPH%: 49.3 % — ABNORMAL HIGH (ref 14.0–49.0)
MCH: 30 pg (ref 27.2–33.4)
MCHC: 33.8 g/dL (ref 32.0–36.0)
MCV: 88.9 fL (ref 79.3–98.0)
MONO#: 0.1 10*3/uL (ref 0.1–0.9)
MONO%: 3.3 % (ref 0.0–14.0)
NEUT%: 45.4 % (ref 39.0–75.0)
NEUTROS ABS: 0.9 10*3/uL — AB (ref 1.5–6.5)
Platelets: 512 10*3/uL — ABNORMAL HIGH (ref 140–400)
RBC: 3.97 10*6/uL — AB (ref 4.20–5.82)
RDW: 12.9 % (ref 11.0–14.6)
WBC: 1.9 10*3/uL — AB (ref 4.0–10.3)

## 2014-09-12 LAB — COMPREHENSIVE METABOLIC PANEL (CC13)
ALT: 24 U/L (ref 0–55)
AST: 18 U/L (ref 5–34)
Albumin: 3.8 g/dL (ref 3.5–5.0)
Alkaline Phosphatase: 70 U/L (ref 40–150)
Anion Gap: 8 meq/L (ref 3–11)
BUN: 11.8 mg/dL (ref 7.0–26.0)
CO2: 25 meq/L (ref 22–29)
Calcium: 9.4 mg/dL (ref 8.4–10.4)
Chloride: 96 meq/L — ABNORMAL LOW (ref 98–109)
Creatinine: 1 mg/dL (ref 0.7–1.3)
Glucose: 102 mg/dL (ref 70–140)
Potassium: 4.9 meq/L (ref 3.5–5.1)
Sodium: 130 meq/L — ABNORMAL LOW (ref 136–145)
Total Bilirubin: 0.21 mg/dL (ref 0.20–1.20)
Total Protein: 7.2 g/dL (ref 6.4–8.3)

## 2014-09-12 MED ORDER — OXYBUTYNIN CHLORIDE 5 MG PO TABS: 5.0000 mg | ORAL_TABLET | Freq: Three times a day (TID) | ORAL | Status: DC | PRN

## 2014-09-12 NOTE — Telephone Encounter (Signed)
gv adn printed appt sched adn avs for pt for DEC

## 2014-09-12 NOTE — Progress Notes (Signed)
Hematology and Oncology Follow Up Visit  Donald Decker [id number] [birth year] 68 y.o. 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 He had a T3 N0 disease.   Prior Therapy: On 07/24/2014, he underwent TURBT which he tolerated very well. 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 There is also a small cell component was noted in the posterior dome  Current therapy: neoadjuvant systemic chemotherapy with cisplatin and Gemzar cycle one given on 08/17/2014 today is day 8 of cycle 2. Plan for total of 3 cycles.  Interim History:  Donald Decker presents today for a follow-up visit. Since the last visit, he is feeling relatively well. He tolerated chemotherapy without any complications. He complains of the vein and that was used for his last chemotherapy had some redness and tenderness after treatment attributed mostly to Gemzar. He is doing a better job of hydration at this time. He does not report any nausea, vomiting or peripheral neuropathy. He does not report any back pain or shoulder pain or flank pain. He does not report any constitutional symptoms her weight loss or appetite changes. He does report fatigue and tiredness but still mobile at this time using a cane. He does not report any headaches, blurred vision, double vision or syncope. He does not report any chest pain, palpitation or leg edema. He does not report any cough, shortness of breath or hemoptysis. He does not report any nausea, vomiting, constipation or diarrhea. He does not report any skeletal complaints. Remainder of his review of systems unremarkable.  Medications: I have reviewed the patient's current medications.  Current Outpatient Prescriptions  Medication Sig Dispense Refill  . aspirin EC 81 MG tablet Take 81 mg by mouth daily.    .  Cholecalciferol (VITAMIN D3) 1000 UNITS CAPS Take 3 capsules by mouth daily.    . diazepam (VALIUM) 5 MG tablet Take 5 mg by mouth daily.  5  . losartan-hydrochlorothiazide (HYZAAR) 100-25 MG per tablet Take 1 tablet by mouth every evening.    . Multiple Vitamins-Minerals (MULTIVITAMIN ADULTS 50+ PO) Take 1 tablet by mouth daily.    Marland Kitchen oxybutynin (DITROPAN) 5 MG tablet Take 1 tablet (5 mg total) by mouth every 8 (eight) hours as needed for bladder spasms. 30 tablet 3  . pantoprazole (PROTONIX) 40 MG tablet Take 40 mg by mouth every evening.    . prochlorperazine (COMPAZINE) 10 MG tablet Take 1 tablet (10 mg total) by mouth every 6 (six) hours as needed for nausea or vomiting. 30 tablet 0  . sertraline (ZOLOFT) 100 MG tablet Take 100 mg by mouth every evening.    . vitamin B-12 (CYANOCOBALAMIN) 1000 MCG tablet Take 1,000 mcg by mouth daily.    . vitamin C (ASCORBIC ACID) 500 MG tablet Take 500 mg by mouth daily.     No current facility-administered medications for this visit.     Allergies:  Allergies  Allergen Reactions  . Latex Other (See Comments)    BLISTERING/ BRUISEING  . Tagamet [Cimetidine] Hives    Past Medical History, Surgical history, Social history, and Family History were reviewed and updated.  Physical Exam: Blood pressure 167/74, pulse 78, temperature 97.7 F (36.5 C), temperature source Oral, resp. rate 18, height 5\' 10"  (1.778 m), weight 191 lb (86.637 kg). ECOG: 1 General appearance: alert and cooperative Head: Normocephalic, without obvious abnormality Neck: no adenopathy Lymph nodes: Cervical, supraclavicular,  and axillary nodes normal. Heart:regular rate and rhythm, S1, S2 normal, no murmur, click, rub or gallop Lung:chest clear, no wheezing, rales, normal symmetric air entry Abdomin: soft, non-tender, without masses or organomegaly EXT:no erythema, induration, or nodules. I could not appreciate any phlebitis or vein issues.   Lab Results: Lab Results   Component Value Date   WBC 1.9* 09/12/2014   HGB 11.9* 09/12/2014   HCT 35.3* 09/12/2014   MCV 88.9 09/12/2014   PLT 512* 09/12/2014     Chemistry      Component Value Date/Time   NA 130* 09/12/2014 0953   NA 127* 07/24/2014 0635   K 4.9 09/12/2014 0953   K 4.2 07/24/2014 0635   CO2 25 09/12/2014 0953   BUN 11.8 09/12/2014 0953   CREATININE 1.0 09/12/2014 0953      Component Value Date/Time   CALCIUM 9.4 09/12/2014 0953   ALKPHOS 70 09/12/2014 0953   AST 18 09/12/2014 0953   ALT 24 09/12/2014 0953   BILITOT 0.21 09/12/2014 0953       Impression and Plan:   68 year old with the following issues:  1. 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 The patient is a status post transurethral resection of the bladder tumor on 07/24/2014 with the pathology revealed high-grade urothelial carcinoma with at least one portion involvement with small cell component. The clinical staging is T3 N0. He is currently receiving neoadjuvant chemotherapy with cisplatin and Gemzar. His day 8 of cycle 2 will be omitted today due to neutropenia.  2. IV access: He continues to refuse Port-A-Cath and prefers his peripheral veins.  3. Anti-emetics: Continue Compazine as prescribed.  4. Renal insufficiency: His baseline creatinine is normal and will continue to follow that closely on cis-platinum.  5. Follow up: will be in 2 weeks for cycle 3 day 1.   SWHQPR,FFMBW, MD 11/25/201510:48 AM

## 2014-09-14 ENCOUNTER — Other Ambulatory Visit: Payer: Medicare HMO

## 2014-09-19 ENCOUNTER — Ambulatory Visit: Payer: Medicare HMO

## 2014-09-19 ENCOUNTER — Other Ambulatory Visit: Payer: Self-pay | Admitting: *Deleted

## 2014-09-19 ENCOUNTER — Other Ambulatory Visit: Payer: Medicare HMO

## 2014-09-26 ENCOUNTER — Emergency Department: Payer: Self-pay | Admitting: Emergency Medicine

## 2014-09-27 ENCOUNTER — Other Ambulatory Visit (HOSPITAL_BASED_OUTPATIENT_CLINIC_OR_DEPARTMENT_OTHER): Payer: Medicare HMO

## 2014-09-27 ENCOUNTER — Encounter: Payer: Self-pay | Admitting: Physician Assistant

## 2014-09-27 ENCOUNTER — Ambulatory Visit (HOSPITAL_BASED_OUTPATIENT_CLINIC_OR_DEPARTMENT_OTHER): Payer: Medicare HMO

## 2014-09-27 ENCOUNTER — Ambulatory Visit (HOSPITAL_BASED_OUTPATIENT_CLINIC_OR_DEPARTMENT_OTHER): Payer: Medicare HMO | Admitting: Physician Assistant

## 2014-09-27 VITALS — BP 145/83 | HR 79 | Temp 98.1°F | Resp 18 | Ht 70.0 in | Wt 188.1 lb

## 2014-09-27 DIAGNOSIS — N289 Disorder of kidney and ureter, unspecified: Secondary | ICD-10-CM | POA: Diagnosis not present

## 2014-09-27 DIAGNOSIS — C671 Malignant neoplasm of dome of bladder: Secondary | ICD-10-CM

## 2014-09-27 DIAGNOSIS — C670 Malignant neoplasm of trigone of bladder: Secondary | ICD-10-CM

## 2014-09-27 DIAGNOSIS — D709 Neutropenia, unspecified: Secondary | ICD-10-CM

## 2014-09-27 DIAGNOSIS — Z5111 Encounter for antineoplastic chemotherapy: Secondary | ICD-10-CM

## 2014-09-27 LAB — COMPREHENSIVE METABOLIC PANEL (CC13)
ALT: 16 U/L (ref 0–55)
AST: 15 U/L (ref 5–34)
Albumin: 3.9 g/dL (ref 3.5–5.0)
Alkaline Phosphatase: 70 U/L (ref 40–150)
Anion Gap: 10 mEq/L (ref 3–11)
BUN: 10.8 mg/dL (ref 7.0–26.0)
CO2: 26 mEq/L (ref 22–29)
Calcium: 9.7 mg/dL (ref 8.4–10.4)
Chloride: 96 mEq/L — ABNORMAL LOW (ref 98–109)
Creatinine: 1 mg/dL (ref 0.7–1.3)
EGFR: 74 mL/min/{1.73_m2} — ABNORMAL LOW (ref >90)
Glucose: 93 mg/dl (ref 70–140)
Potassium: 4.8 mEq/L (ref 3.5–5.1)
Sodium: 132 mEq/L — ABNORMAL LOW (ref 136–145)
Total Bilirubin: 0.3 mg/dL (ref 0.20–1.20)
Total Protein: 7.3 g/dL (ref 6.4–8.3)

## 2014-09-27 LAB — CBC WITH DIFFERENTIAL/PLATELET
BASO%: 0.2 % (ref 0.0–2.0)
Basophils Absolute: 0 10*3/uL (ref 0.0–0.1)
EOS%: 1.1 % (ref 0.0–7.0)
Eosinophils Absolute: 0.1 10*3/uL (ref 0.0–0.5)
HCT: 36.3 % — ABNORMAL LOW (ref 38.4–49.9)
HGB: 12.6 g/dL — ABNORMAL LOW (ref 13.0–17.1)
LYMPH%: 29.8 % (ref 14.0–49.0)
MCH: 30.1 pg (ref 27.2–33.4)
MCHC: 34.7 g/dL (ref 32.0–36.0)
MCV: 86.6 fL (ref 79.3–98.0)
MONO#: 0.9 10*3/uL (ref 0.1–0.9)
MONO%: 17.6 % — ABNORMAL HIGH (ref 0.0–14.0)
NEUT#: 2.7 10*3/uL (ref 1.5–6.5)
NEUT%: 51.3 % (ref 39.0–75.0)
Platelets: 293 10*3/uL (ref 140–400)
RBC: 4.19 10*6/uL — AB (ref 4.20–5.82)
RDW: 14 % (ref 11.0–14.6)
WBC: 5.3 10*3/uL (ref 4.0–10.3)
lymph#: 1.6 10*3/uL (ref 0.9–3.3)

## 2014-09-27 MED ORDER — SODIUM CHLORIDE 0.9 % IV SOLN
50.0000 mg/m2 | Freq: Once | INTRAVENOUS | Status: AC
  Administered 2014-09-27: 103 mg via INTRAVENOUS
  Filled 2014-09-27: qty 103

## 2014-09-27 MED ORDER — POTASSIUM CHLORIDE 2 MEQ/ML IV SOLN
Freq: Once | INTRAVENOUS | Status: AC
  Administered 2014-09-27: 12:00:00 via INTRAVENOUS
  Filled 2014-09-27: qty 10

## 2014-09-27 MED ORDER — DEXAMETHASONE SODIUM PHOSPHATE 20 MG/5ML IJ SOLN
INTRAMUSCULAR | Status: AC
Start: 2014-09-27 — End: 2014-09-27
  Filled 2014-09-27: qty 5

## 2014-09-27 MED ORDER — DEXAMETHASONE SODIUM PHOSPHATE 20 MG/5ML IJ SOLN
12.0000 mg | Freq: Once | INTRAMUSCULAR | Status: AC
  Administered 2014-09-27: 12 mg via INTRAVENOUS

## 2014-09-27 MED ORDER — PALONOSETRON HCL INJECTION 0.25 MG/5ML
INTRAVENOUS | Status: AC
Start: 2014-09-27 — End: 2014-09-27
  Filled 2014-09-27: qty 5

## 2014-09-27 MED ORDER — DEXAMETHASONE SODIUM PHOSPHATE 20 MG/5ML IJ SOLN
INTRAMUSCULAR | Status: AC
  Filled 2014-09-27: qty 5

## 2014-09-27 MED ORDER — SODIUM CHLORIDE 0.9 % IV SOLN
150.0000 mg | Freq: Once | INTRAVENOUS | Status: AC
  Administered 2014-09-27: 150 mg via INTRAVENOUS
  Filled 2014-09-27 (×2): qty 5

## 2014-09-27 MED ORDER — PROCHLORPERAZINE MALEATE 10 MG PO TABS: 10.0000 mg | ORAL_TABLET | Freq: Four times a day (QID) | ORAL | Status: DC | PRN

## 2014-09-27 MED ORDER — PALONOSETRON HCL INJECTION 0.25 MG/5ML
0.2500 mg | Freq: Once | INTRAVENOUS | Status: AC
  Administered 2014-09-27: 0.25 mg via INTRAVENOUS

## 2014-09-27 MED ORDER — SODIUM CHLORIDE 0.9 % IV SOLN
1000.0000 mg/m2 | Freq: Once | INTRAVENOUS | Status: AC
  Administered 2014-09-27: 2052 mg via INTRAVENOUS
  Filled 2014-09-27: qty 53.97

## 2014-09-27 NOTE — Progress Notes (Signed)
Hematology and Oncology Follow Up Visit  Donald Decker [id number] [birth year] 68 y.o. 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 He had a T3 N0 disease.   Prior Therapy: On 07/24/2014, he underwent TURBT which he tolerated very well. 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 There is also a small cell component was noted in the posterior dome  Current therapy: neoadjuvant systemic chemotherapy with cisplatin and Gemzar.  Cycle one given on 08/17/2014.  Today is day 1 of cycle 3. Plan for total of 3 cycles.  Interim History:  Donald Decker presents today for a follow-up visit. Since the last visit, he is feeling relatively well. He tolerated chemotherapy without any complications except for some nausea and vomiting. He requests a refill for his Compazine. His wife states that he fell recently while trimming bushes and was evaluated in the emergency room. He did not sustain any fractures. He did not receive day 8 of cycle #2 secondary to neutropenia.  He continues to do a better job of hydration at this time. He does not report any peripheral neuropathy. He does not report any back pain or shoulder pain or flank pain. He does not report any constitutional symptoms her weight loss or appetite changes. He does report fatigue and tiredness but still mobile at this time using a cane. He does not report any headaches, blurred vision, double vision or syncope. He does not report any chest pain, palpitation or leg edema. He does not report any cough, shortness of breath or hemoptysis. He does not report any nausea, vomiting, constipation or diarrhea. He does not report any skeletal complaints. Remainder of his review of systems unremarkable.  Medications: I have reviewed the patient's current medications.  Current Outpatient  Prescriptions  Medication Sig Dispense Refill  . aspirin EC 81 MG tablet Take 81 mg by mouth daily.    . Cholecalciferol (VITAMIN D3) 1000 UNITS CAPS Take 3 capsules by mouth daily.    . diazepam (VALIUM) 5 MG tablet Take 5 mg by mouth daily.  5  . losartan-hydrochlorothiazide (HYZAAR) 100-25 MG per tablet Take 1 tablet by mouth every evening.    . Multiple Vitamins-Minerals (MULTIVITAMIN ADULTS 50+ PO) Take 1 tablet by mouth daily.    Marland Kitchen oxybutynin (DITROPAN) 5 MG tablet Take 1 tablet (5 mg total) by mouth every 8 (eight) hours as needed for bladder spasms. 30 tablet 3  . pantoprazole (PROTONIX) 40 MG tablet Take 40 mg by mouth every evening.    . prochlorperazine (COMPAZINE) 10 MG tablet Take 1 tablet (10 mg total) by mouth every 6 (six) hours as needed for nausea or vomiting. 30 tablet 0  . sertraline (ZOLOFT) 100 MG tablet Take 100 mg by mouth every evening.    . vitamin B-12 (CYANOCOBALAMIN) 1000 MCG tablet Take 1,000 mcg by mouth daily.    . vitamin C (ASCORBIC ACID) 500 MG tablet Take 500 mg by mouth daily.     No current facility-administered medications for this visit.   Facility-Administered Medications Ordered in Other Visits  Medication Dose Route Frequency Provider Last Rate Last Dose  . CISplatin (PLATINOL) 103 mg in sodium chloride 0.9 % 500 mL chemo infusion  50 mg/m2 (Treatment Plan Actual) Intravenous Once Wyatt Portela, Decker      . Dexamethasone Sodium Phosphate (DECADRON) injection 12 mg  12 mg Intravenous Once [employer],  Decker      . fosaprepitant (EMEND) 150 mg in sodium chloride 0.9 % 145 mL IVPB  150 mg Intravenous Once Wyatt Portela, Decker 300 mL/hr at 09/27/14 1421 150 mg at 09/27/14 1421  . Gemcitabine HCl (GEMZAR) 2,052 mg in sodium chloride 0.9 % 250 mL chemo infusion  1,000 mg/m2 (Treatment Plan Actual) Intravenous Once Wyatt Portela, Decker      . palonosetron (ALOXI) injection 0.25 mg  0.25 mg Intravenous Once Wyatt Portela, Decker         Allergies:  Allergies   Allergen Reactions  . Latex Other (See Comments)    BLISTERING/ BRUISEING  . Tagamet [Cimetidine] Hives    Past Medical History, Surgical history, Social history, and Family History were reviewed and updated.  Physical Exam: Blood pressure 145/83, pulse 79, temperature 98.1 F (36.7 C), temperature source Oral, resp. rate 18, height 5\' 10"  (1.778 m), weight 188 lb 1.6 oz (85.322 kg), SpO2 100 %. ECOG: 1 General appearance: alert and cooperative Head: Normocephalic, without obvious abnormality Neck: no adenopathy Lymph nodes: Cervical, supraclavicular, and axillary nodes normal. Heart:regular rate and rhythm, S1, S2 normal, no murmur, click, rub or gallop Lung:chest clear, no wheezing, rales, normal symmetric air entry Abdomin: soft, non-tender, without masses or organomegaly EXT:no erythema, induration, or nodules. I could not appreciate any phlebitis or vein issues.   Lab Results: Lab Results  Component Value Date   WBC 5.3 09/27/2014   HGB 12.6* 09/27/2014   HCT 36.3* 09/27/2014   MCV 86.6 09/27/2014   PLT 293 09/27/2014     Chemistry      Component Value Date/Time   NA 132* 09/27/2014 0925   NA 127* 07/24/2014 0635   K 4.8 09/27/2014 0925   K 4.2 07/24/2014 0635   CO2 26 09/27/2014 0925   BUN 10.8 09/27/2014 0925   CREATININE 1.0 09/27/2014 0925      Component Value Date/Time   CALCIUM 9.7 09/27/2014 0925   ALKPHOS 70 09/27/2014 0925   AST 15 09/27/2014 0925   ALT 16 09/27/2014 0925   BILITOT 0.30 09/27/2014 0925       Impression and Plan:   68 year old with the following issues:  1. 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 The patient is a status post transurethral resection of the bladder tumor on 07/24/2014 with the pathology revealed high-grade urothelial carcinoma with at least one portion involvement with small cell component. The clinical staging is T3 N0. He is  currently receiving neoadjuvant chemotherapy with cisplatin and Gemzar. His day 8 of cycle 2 will be omitted today due to neutropenia.  2. IV access: He continues to refuse Port-A-Cath and prefers his peripheral veins.  3. Anti-emetics: Continue Compazine as prescribed. Refill prescription sent to his pharmacy of record via [facility] today.  4. Renal insufficiency: His baseline creatinine is normal and will continue to follow that closely on cis-platinum.  5. Follow up: will be in 21 weeks for cycle 3 day 8.  The patient requested a copy of his pathology report and this was provided to him.  Breelynn Bankert E, PA-C  12/10/20152:27 PM

## 2014-09-27 NOTE — Patient Instructions (Addendum)
[facility] Discharge Instructions for Patients Receiving Chemotherapy  Today you received the following chemotherapy agents: Gemzar and Cisplatin.  To help prevent nausea and vomiting after your treatment, we encourage you to take your nausea medication as prescribed.   If you develop nausea and vomiting that is not controlled by your nausea medication, call the clinic.   BELOW ARE SYMPTOMS THAT SHOULD BE REPORTED IMMEDIATELY:  *FEVER GREATER THAN 100.5 F  *CHILLS WITH OR WITHOUT FEVER  NAUSEA AND VOMITING THAT IS NOT CONTROLLED WITH YOUR NAUSEA MEDICATION  *UNUSUAL SHORTNESS OF BREATH  *UNUSUAL BRUISING OR BLEEDING  TENDERNESS IN MOUTH AND THROAT WITH OR WITHOUT PRESENCE OF ULCERS  *URINARY PROBLEMS  *BOWEL PROBLEMS  UNUSUAL RASH Items with * indicate a potential emergency and should be followed up as soon as possible.  Feel free to call the clinic you have any questions or concerns. The clinic phone number is [phone].    

## 2014-09-27 NOTE — Patient Instructions (Signed)
Follow up in 1 week with labs and chemotherapy as scheduled

## 2014-10-01 ENCOUNTER — Telehealth: Payer: Self-pay | Admitting: Surgery

## 2014-10-01 NOTE — Telephone Encounter (Signed)
Encounter opened in error

## 2014-10-04 ENCOUNTER — Other Ambulatory Visit (HOSPITAL_BASED_OUTPATIENT_CLINIC_OR_DEPARTMENT_OTHER): Payer: Medicare HMO

## 2014-10-04 ENCOUNTER — Telehealth: Payer: Self-pay | Admitting: Oncology

## 2014-10-04 ENCOUNTER — Ambulatory Visit (HOSPITAL_BASED_OUTPATIENT_CLINIC_OR_DEPARTMENT_OTHER): Payer: Medicare HMO | Admitting: Oncology

## 2014-10-04 ENCOUNTER — Ambulatory Visit (HOSPITAL_BASED_OUTPATIENT_CLINIC_OR_DEPARTMENT_OTHER): Payer: Medicare HMO

## 2014-10-04 VITALS — BP 161/88 | HR 91 | Temp 98.3°F | Resp 19 | Ht 70.0 in | Wt 186.9 lb

## 2014-10-04 DIAGNOSIS — C670 Malignant neoplasm of trigone of bladder: Secondary | ICD-10-CM

## 2014-10-04 DIAGNOSIS — C671 Malignant neoplasm of dome of bladder: Secondary | ICD-10-CM

## 2014-10-04 DIAGNOSIS — Z5111 Encounter for antineoplastic chemotherapy: Secondary | ICD-10-CM

## 2014-10-04 DIAGNOSIS — N289 Disorder of kidney and ureter, unspecified: Secondary | ICD-10-CM

## 2014-10-04 LAB — COMPREHENSIVE METABOLIC PANEL (CC13)
ALT: 32 U/L (ref 0–55)
ANION GAP: 12 meq/L — AB (ref 3–11)
AST: 25 U/L (ref 5–34)
Albumin: 4.2 g/dL (ref 3.5–5.0)
Alkaline Phosphatase: 76 U/L (ref 40–150)
BUN: 16.1 mg/dL (ref 7.0–26.0)
CALCIUM: 10 mg/dL (ref 8.4–10.4)
CHLORIDE: 93 meq/L — AB (ref 98–109)
CO2: 25 meq/L (ref 22–29)
CREATININE: 1.2 mg/dL (ref 0.7–1.3)
EGFR: 63 mL/min/{1.73_m2} — ABNORMAL LOW (ref >90)
GLUCOSE: 100 mg/dL (ref 70–140)
POTASSIUM: 4 meq/L (ref 3.5–5.1)
Sodium: 130 mEq/L — ABNORMAL LOW (ref 136–145)
Total Bilirubin: 0.38 mg/dL (ref 0.20–1.20)
Total Protein: 7.9 g/dL (ref 6.4–8.3)

## 2014-10-04 LAB — CBC WITH DIFFERENTIAL/PLATELET
BASO%: 1.2 % (ref 0.0–2.0)
BASOS ABS: 0 10*3/uL (ref 0.0–0.1)
EOS ABS: 0 10*3/uL (ref 0.0–0.5)
EOS%: 0.6 % (ref 0.0–7.0)
HCT: 37.7 % — ABNORMAL LOW (ref 38.4–49.9)
HEMOGLOBIN: 13.5 g/dL (ref 13.0–17.1)
LYMPH#: 1.1 10*3/uL (ref 0.9–3.3)
LYMPH%: 33.5 % (ref 14.0–49.0)
MCH: 30.4 pg (ref 27.2–33.4)
MCHC: 35.8 g/dL (ref 32.0–36.0)
MCV: 84.9 fL (ref 79.3–98.0)
MONO#: 0.1 10*3/uL (ref 0.1–0.9)
MONO%: 3.6 % (ref 0.0–14.0)
NEUT#: 2.1 10*3/uL (ref 1.5–6.5)
NEUT%: 61.1 % (ref 39.0–75.0)
PLATELETS: 124 10*3/uL — AB (ref 140–400)
RBC: 4.44 10*6/uL (ref 4.20–5.82)
RDW: 13.7 % (ref 11.0–14.6)
WBC: 3.4 10*3/uL — AB (ref 4.0–10.3)
nRBC: 0 % (ref 0–0)

## 2014-10-04 MED ORDER — PROCHLORPERAZINE MALEATE 10 MG PO TABS
ORAL_TABLET | ORAL | Status: AC
  Filled 2014-10-04: qty 1

## 2014-10-04 MED ORDER — PROCHLORPERAZINE MALEATE 10 MG PO TABS
10.0000 mg | ORAL_TABLET | Freq: Once | ORAL | Status: AC
  Administered 2014-10-04: 10 mg via ORAL

## 2014-10-04 MED ORDER — SODIUM CHLORIDE 0.9 % IV SOLN
1000.0000 mg/m2 | Freq: Once | INTRAVENOUS | Status: AC
  Administered 2014-10-04: 2052 mg via INTRAVENOUS
  Filled 2014-10-04: qty 53.97

## 2014-10-04 MED ORDER — SODIUM CHLORIDE 0.9 % IV SOLN
Freq: Once | INTRAVENOUS | Status: AC
  Administered 2014-10-04: 11:00:00 via INTRAVENOUS

## 2014-10-04 NOTE — Progress Notes (Signed)
Hematology and Oncology Follow Up Visit  Donald Decker [id number] 11-Jan-1946 68 y.o. 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 He had a T3 N0 disease.   Prior Therapy: On 07/24/2014, he underwent TURBT which he tolerated very well. 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 There is also a small cell component was noted in the posterior dome  Current therapy: neoadjuvant systemic chemotherapy with cisplatin and Gemzar.  Cycle one given on 08/17/2014.  Today is day 8 of cycle 3. Plan for total of 3 cycles.  Interim History:  Donald Decker presents today for a follow-up visit. Since the last visit, he is doing better. He tolerated chemotherapy without any major complications. He did report nausea and vomiting after day 1 of this cycle. Symptoms have improved and he is able to tolerate by mouth without any major complications. He continues to do a better job of hydration at this time. He does not report any peripheral neuropathy. He does not report any back pain or shoulder pain or flank pain. He does not report any constitutional symptoms her weight loss or appetite changes. He does report fatigue and tiredness but still mobile at this time using a cane. He does not report any headaches, blurred vision, double vision or syncope. He does not report any chest pain, palpitation or leg edema. He does not report any cough, shortness of breath or hemoptysis. He does not report any nausea, vomiting, constipation or diarrhea. He does not report any skeletal complaints. Remainder of his review of systems unremarkable.  Medications: I have reviewed the patient's current medications.  Current Outpatient Prescriptions  Medication Sig Dispense Refill  . aspirin EC 81 MG tablet Take 81 mg by mouth daily.    .  Cholecalciferol (VITAMIN D3) 1000 UNITS CAPS Take 1 capsule by mouth daily. Takes 200 units    . diazepam (VALIUM) 5 MG tablet Take 5 mg by mouth daily.  5  . losartan-hydrochlorothiazide (HYZAAR) 100-25 MG per tablet Take 1 tablet by mouth every evening.    . Multiple Vitamins-Minerals (MULTIVITAMIN ADULTS 50+ PO) Take 1 tablet by mouth daily.    Marland Kitchen oxybutynin (DITROPAN) 5 MG tablet Take 1 tablet (5 mg total) by mouth every 8 (eight) hours as needed for bladder spasms. 30 tablet 3  . pantoprazole (PROTONIX) 40 MG tablet Take 40 mg by mouth every evening.    . prochlorperazine (COMPAZINE) 10 MG tablet Take 1 tablet (10 mg total) by mouth every 6 (six) hours as needed for nausea or vomiting. 30 tablet 0  . sertraline (ZOLOFT) 100 MG tablet Take 100 mg by mouth every evening.    . vitamin B-12 (CYANOCOBALAMIN) 1000 MCG tablet Take 1,000 mcg by mouth daily.    . vitamin C (ASCORBIC ACID) 500 MG tablet Take 500 mg by mouth daily.     No current facility-administered medications for this visit.     Allergies:  Allergies  Allergen Reactions  . Latex Other (See Comments)    BLISTERING/ BRUISEING  . Tagamet [Cimetidine] Hives    Past Medical History, Surgical history, Social history, and Family History were reviewed and updated.  Physical Exam: Blood pressure 161/88, pulse 91, temperature 98.3 F (36.8 C), temperature source Oral, resp. rate 19, height 5\' 10"  (1.778 m), weight 186 lb 14.4 oz (84.777 kg), SpO2 97 %. ECOG: 1 General appearance: alert  and cooperative Head: Normocephalic, without obvious abnormality Neck: no adenopathy Lymph nodes: Cervical, supraclavicular, and axillary nodes normal. Heart:regular rate and rhythm, S1, S2 normal, no murmur, click, rub or gallop Lung:chest clear, no wheezing, rales, normal symmetric air entry Abdomin: soft, non-tender, without masses or organomegaly EXT:no erythema, induration, or nodules. I could not appreciate any phlebitis or vein  issues.   Lab Results: Lab Results  Component Value Date   WBC 3.4* 10/04/2014   HGB 13.5 10/04/2014   HCT 37.7* 10/04/2014   MCV 84.9 10/04/2014   PLT 124* 10/04/2014     Chemistry      Component Value Date/Time   NA 130* 10/04/2014 0908   NA 127* 07/24/2014 0635   K 4.0 10/04/2014 0908   K 4.2 07/24/2014 0635   CO2 25 10/04/2014 0908   BUN 16.1 10/04/2014 0908   CREATININE 1.2 10/04/2014 0908      Component Value Date/Time   CALCIUM 10.0 10/04/2014 0908   ALKPHOS 76 10/04/2014 0908   AST 25 10/04/2014 0908   ALT 32 10/04/2014 0908   BILITOT 0.38 10/04/2014 0908       Impression and Plan:   68 year old with the following issues:  1. 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 The patient is a status post transurethral resection of the bladder tumor on 07/24/2014 with the pathology revealed high-grade urothelial carcinoma with at least one portion involvement with small cell component. The clinical staging is T3 N0. He is currently receiving neoadjuvant chemotherapy with cisplatin and Gemzar. His day 8 of cycle 2 was admitted due to neutropenia. He is ready to proceed with cycle 3 day 8 today. I plan on repeat imaging studies in January 2016. He is being evaluated also for possible cystectomy by Dr. Junious Silk.  2. IV access: He continues to refuse Port-A-Cath and prefers his peripheral veins.  3. Anti-emetics: Continue Compazine as prescribed.   4. Renal insufficiency: His baseline creatinine is normal and will continue to follow that closely on cis-platinum.  5. Follow up: will be January 2016 after a CT scan.    ZOXWRU,EAVWU, MD 12/17/201510:14 AM

## 2014-10-04 NOTE — Patient Instructions (Signed)
[facility] Discharge Instructions for Patients Receiving Chemotherapy  Today you received the following chemotherapy agents Gemzar.  To help prevent nausea and vomiting after your treatment, we encourage you to take your nausea medication as prescribed.   If you develop nausea and vomiting that is not controlled by your nausea medication, call the clinic.   BELOW ARE SYMPTOMS THAT SHOULD BE REPORTED IMMEDIATELY:  *FEVER GREATER THAN 100.5 F  *CHILLS WITH OR WITHOUT FEVER  NAUSEA AND VOMITING THAT IS NOT CONTROLLED WITH YOUR NAUSEA MEDICATION  *UNUSUAL SHORTNESS OF BREATH  *UNUSUAL BRUISING OR BLEEDING  TENDERNESS IN MOUTH AND THROAT WITH OR WITHOUT PRESENCE OF ULCERS  *URINARY PROBLEMS  *BOWEL PROBLEMS  UNUSUAL RASH Items with * indicate a potential emergency and should be followed up as soon as possible.  Feel free to call the clinic you have any questions or concerns. The clinic phone number is [phone].    

## 2014-10-04 NOTE — Telephone Encounter (Signed)
Pt confirmed labs/ov per 12/17 POF, gave pt AVS...Marland KitchenMarland KitchenCherylann Banas, CT scan scheduled with pt

## 2014-10-23 ENCOUNTER — Other Ambulatory Visit (HOSPITAL_BASED_OUTPATIENT_CLINIC_OR_DEPARTMENT_OTHER): Payer: Medicare HMO

## 2014-10-23 ENCOUNTER — Encounter (HOSPITAL_COMMUNITY): Payer: Self-pay

## 2014-10-23 ENCOUNTER — Ambulatory Visit (HOSPITAL_COMMUNITY)
Admission: RE | Admit: 2014-10-23 | Discharge: 2014-10-23 | Disposition: A | Discharge status: Home / Self Care | Payer: Medicare HMO | Source: Ambulatory Visit | Attending: Oncology | Admitting: Oncology

## 2014-10-23 DIAGNOSIS — I700 Atherosclerosis of aorta: Secondary | ICD-10-CM | POA: Diagnosis not present

## 2014-10-23 DIAGNOSIS — C670 Malignant neoplasm of trigone of bladder: Secondary | ICD-10-CM

## 2014-10-23 DIAGNOSIS — K5730 Diverticulosis of large intestine without perforation or abscess without bleeding: Secondary | ICD-10-CM | POA: Insufficient documentation

## 2014-10-23 DIAGNOSIS — C673 Malignant neoplasm of anterior wall of bladder: Secondary | ICD-10-CM | POA: Insufficient documentation

## 2014-10-23 DIAGNOSIS — R918 Other nonspecific abnormal finding of lung field: Secondary | ICD-10-CM | POA: Insufficient documentation

## 2014-10-23 DIAGNOSIS — J438 Other emphysema: Secondary | ICD-10-CM | POA: Diagnosis not present

## 2014-10-23 DIAGNOSIS — I2510 Atherosclerotic heart disease of native coronary artery without angina pectoris: Secondary | ICD-10-CM | POA: Diagnosis not present

## 2014-10-23 DIAGNOSIS — N261 Atrophy of kidney (terminal): Secondary | ICD-10-CM | POA: Insufficient documentation

## 2014-10-23 DIAGNOSIS — I350 Nonrheumatic aortic (valve) stenosis: Secondary | ICD-10-CM | POA: Diagnosis not present

## 2014-10-23 DIAGNOSIS — I7090 Unspecified atherosclerosis: Secondary | ICD-10-CM | POA: Insufficient documentation

## 2014-10-23 DIAGNOSIS — J432 Centrilobular emphysema: Secondary | ICD-10-CM | POA: Insufficient documentation

## 2014-10-23 DIAGNOSIS — Z9049 Acquired absence of other specified parts of digestive tract: Secondary | ICD-10-CM | POA: Insufficient documentation

## 2014-10-23 DIAGNOSIS — C677 Malignant neoplasm of urachus: Secondary | ICD-10-CM | POA: Diagnosis not present

## 2014-10-23 DIAGNOSIS — Z9221 Personal history of antineoplastic chemotherapy: Secondary | ICD-10-CM | POA: Insufficient documentation

## 2014-10-23 LAB — COMPREHENSIVE METABOLIC PANEL (CC13)
ALBUMIN: 4.1 g/dL (ref 3.5–5.0)
ALT: 14 U/L (ref 0–55)
AST: 17 U/L (ref 5–34)
Alkaline Phosphatase: 80 U/L (ref 40–150)
Anion Gap: 10 mEq/L (ref 3–11)
BUN: 16.2 mg/dL (ref 7.0–26.0)
CALCIUM: 9.7 mg/dL (ref 8.4–10.4)
CHLORIDE: 94 meq/L — AB (ref 98–109)
CO2: 26 mEq/L (ref 22–29)
Creatinine: 1.1 mg/dL (ref 0.7–1.3)
EGFR: 65 mL/min/{1.73_m2} — ABNORMAL LOW (ref >90)
Glucose: 104 mg/dl (ref 70–140)
POTASSIUM: 5.4 meq/L — AB (ref 3.5–5.1)
Sodium: 130 mEq/L — ABNORMAL LOW (ref 136–145)
TOTAL PROTEIN: 7.5 g/dL (ref 6.4–8.3)
Total Bilirubin: 0.3 mg/dL (ref 0.20–1.20)

## 2014-10-23 LAB — CBC WITH DIFFERENTIAL/PLATELET
BASO%: 0.6 % (ref 0.0–2.0)
Basophils Absolute: 0 10*3/uL (ref 0.0–0.1)
EOS%: 0.5 % (ref 0.0–7.0)
Eosinophils Absolute: 0 10*3/uL (ref 0.0–0.5)
HEMATOCRIT: 36.7 % — AB (ref 38.4–49.9)
HEMOGLOBIN: 12.2 g/dL — AB (ref 13.0–17.1)
LYMPH#: 1.6 10*3/uL (ref 0.9–3.3)
LYMPH%: 27.3 % (ref 14.0–49.0)
MCH: 30.4 pg (ref 27.2–33.4)
MCHC: 33.3 g/dL (ref 32.0–36.0)
MCV: 91.3 fL (ref 79.3–98.0)
MONO#: 0.9 10*3/uL (ref 0.1–0.9)
MONO%: 14.5 % — ABNORMAL HIGH (ref 0.0–14.0)
NEUT#: 3.4 10*3/uL (ref 1.5–6.5)
NEUT%: 57.1 % (ref 39.0–75.0)
PLATELETS: 577 10*3/uL — AB (ref 140–400)
RBC: 4.02 10*6/uL — ABNORMAL LOW (ref 4.20–5.82)
RDW: 15.7 % — ABNORMAL HIGH (ref 11.0–14.6)
WBC: 5.9 10*3/uL (ref 4.0–10.3)

## 2014-10-23 MED ORDER — IOHEXOL 300 MG/ML  SOLN
100.0000 mL | Freq: Once | INTRAMUSCULAR | Status: AC | PRN
  Administered 2014-10-23: 100 mL via INTRAVENOUS

## 2014-10-25 ENCOUNTER — Ambulatory Visit (HOSPITAL_BASED_OUTPATIENT_CLINIC_OR_DEPARTMENT_OTHER): Payer: Medicare HMO | Admitting: Oncology

## 2014-10-25 VITALS — BP 159/73 | HR 91 | Temp 98.0°F | Resp 18 | Ht 70.0 in | Wt 191.4 lb

## 2014-10-25 DIAGNOSIS — C671 Malignant neoplasm of dome of bladder: Secondary | ICD-10-CM | POA: Diagnosis not present

## 2014-10-25 DIAGNOSIS — N289 Disorder of kidney and ureter, unspecified: Secondary | ICD-10-CM | POA: Diagnosis not present

## 2014-10-25 DIAGNOSIS — C670 Malignant neoplasm of trigone of bladder: Secondary | ICD-10-CM

## 2014-10-25 NOTE — Progress Notes (Signed)
Hematology and Oncology Follow Up Visit  Donald Decker [id number] 1946/01/10 69 y.o. 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 He had a T3 N0 disease.   Prior Therapy: On 07/24/2014, he underwent TURBT which he tolerated very well. 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 There is also a small cell component was noted in the posterior dome  Current therapy: neoadjuvant systemic chemotherapy with cisplatin and Gemzar.  Cycle one given on 08/17/2014. He is S/P 3 cycles completed on 10/04/2014.   Interim History:  Donald Decker presents today for a follow-up visit. Since the last visit, he is doing better. He completed chemotherapy without any major complications. Symptoms have improved and he is able to tolerate food and oral intake without any major complications.  He does not report any peripheral neuropathy. He does not report any back pain or shoulder pain or flank pain. He does not report any constitutional symptoms her weight loss or appetite changes. He does report fatigue and tiredness but still mobile at this time using a cane. He does not report any headaches, blurred vision, double vision or syncope. He does not report any chest pain, palpitation or leg edema. He does not report any cough, shortness of breath or hemoptysis. He does not report any nausea, vomiting, constipation or diarrhea. He does not report any skeletal complaints. Remainder of his review of systems unremarkable.  Medications: I have reviewed the patient's current medications.  Current Outpatient Prescriptions  Medication Sig Dispense Refill  . aspirin EC 81 MG tablet Take 81 mg by mouth daily.    . Cholecalciferol (VITAMIN D3) 1000 UNITS CAPS Take 1 capsule by mouth daily. Takes 200 units    . diazepam (VALIUM) 5 MG tablet Take 5  mg by mouth daily.  5  . losartan-hydrochlorothiazide (HYZAAR) 100-25 MG per tablet Take 1 tablet by mouth every evening.    . Melatonin 5 MG TABS Take 5 mg by mouth at bedtime.    . Multiple Vitamins-Minerals (MULTIVITAMIN ADULTS 50+ PO) Take 1 tablet by mouth daily.    Marland Kitchen oxybutynin (DITROPAN) 5 MG tablet Take 1 tablet (5 mg total) by mouth every 8 (eight) hours as needed for bladder spasms. 30 tablet 3  . pantoprazole (PROTONIX) 40 MG tablet Take 40 mg by mouth every evening.    . prochlorperazine (COMPAZINE) 10 MG tablet Take 1 tablet (10 mg total) by mouth every 6 (six) hours as needed for nausea or vomiting. 30 tablet 0  . sertraline (ZOLOFT) 100 MG tablet Take 100 mg by mouth every evening.    . vitamin B-12 (CYANOCOBALAMIN) 1000 MCG tablet Take 1,000 mcg by mouth daily.    . vitamin C (ASCORBIC ACID) 500 MG tablet Take 500 mg by mouth daily.     No current facility-administered medications for this visit.     Allergies:  Allergies  Allergen Reactions  . Latex Other (See Comments)    BLISTERING/ BRUISEING  . Tagamet [Cimetidine] Hives    Past Medical History, Surgical history, Social history, and Family History were reviewed and updated.  Physical Exam: Blood pressure 159/73, pulse 91, temperature 98 F (36.7 C), temperature source Oral, resp. rate 18, height 5\' 10"  (1.778 m), weight 191 lb 6.4 oz (86.818 kg), SpO2 99 %. ECOG: 1 General appearance: alert and cooperative Head: Normocephalic, without obvious abnormality Neck: no adenopathy Lymph  nodes: Cervical, supraclavicular, and axillary nodes normal. Heart:regular rate and rhythm, S1, S2 normal, no murmur, click, rub or gallop Lung:chest clear, no wheezing, rales, normal symmetric air entry Abdomin: soft, non-tender, without masses or organomegaly EXT:no erythema, induration, or nodules.    Lab Results: Lab Results  Component Value Date   WBC 5.9 10/23/2014   HGB 12.2* 10/23/2014   HCT 36.7* 10/23/2014   MCV 91.3  10/23/2014   PLT 577* 10/23/2014     Chemistry      Component Value Date/Time   NA 130* 10/23/2014 1414   NA 127* 07/24/2014 0635   K 5.4* 10/23/2014 1414   K 4.2 07/24/2014 0635   CO2 26 10/23/2014 1414   BUN 16.2 10/23/2014 1414   CREATININE 1.1 10/23/2014 1414      Component Value Date/Time   CALCIUM 9.7 10/23/2014 1414   ALKPHOS 80 10/23/2014 1414   AST 17 10/23/2014 1414   ALT 14 10/23/2014 1414   BILITOT 0.30 10/23/2014 1414      EXAM: CT CHEST, ABDOMEN, AND PELVIS WITH CONTRAST  TECHNIQUE: Multidetector CT imaging of the chest, abdomen and pelvis was performed following the standard protocol during bolus administration of intravenous contrast.  CONTRAST: 117mL OMNIPAQUE IOHEXOL 300 MG/ML SOLN  COMPARISON: CT of the abdomen and pelvis 07/04/2014. No prior chest CT.  FINDINGS: CT CHEST FINDINGS  Mediastinum/Lymph Nodes: Heart size is normal. There is no significant pericardial fluid, thickening or pericardial calcification. There is atherosclerosis of the thoracic aorta, the great vessels of the mediastinum and the coronary arteries, including calcified atherosclerotic plaque in the left anterior descending and right coronary arteries. Severe calcifications of the aortic valve. No pathologically enlarged mediastinal, hilar or axillary lymph nodes. Esophagus is unremarkable in appearance.  Lungs/Pleura: Small cluster of subpleural nodules in the posterior aspect of the right lower lobe on image 42 of series 4, largest of which measures only 3 mm, unchanged compared to prior study 07/04/2014, strongly favored to be benign. No other suspicious appearing pulmonary nodules or masses are noted. No acute consolidative airspace disease. No pleural effusions. Mild diffuse bronchial wall thickening. Mild centrilobular and paraseptal emphysema.  Musculoskeletal/Soft Tissues: There are no aggressive appearing lytic or blastic lesions noted in the visualized  portions of the skeleton.  CT ABDOMEN AND PELVIS FINDINGS  Hepatobiliary: No cystic or solid hepatic lesions. No intra or extrahepatic biliary ductal dilatation. Status post cholecystectomy.  Pancreas: Unremarkable.  Spleen: Unremarkable.  Adrenals/Urinary Tract: Bilateral adrenal glands are normal in appearance. Mild bilateral renal atrophy and diffuse cortical thinning. No cystic or solid renal lesions. No hydroureteronephrosis. On the early postcontrast images, there is some subtle enhancement along the anterior aspect of the urinary bladder wall best appreciated on images 115-116, at site of surgically resected mass. No definite soft tissue mass is identified in this region at this time. Postcontrast delayed images through this region also demonstrate no definite mass in this region.  Stomach/Bowel: Normal appearance of the stomach. No pathologic dilatation of small bowel or colon. Numerous colonic diverticulae are present, particularly in the sigmoid colon, without surrounding inflammatory changes to suggest an acute diverticulitis at this time.  Vascular/Lymphatic: Extensive atherosclerosis is noted throughout the abdominal and pelvic vasculature, without evidence of aneurysm or dissection. No lymphadenopathy noted in the abdomen or pelvis.  Reproductive: Prostate gland and seminal vesicles are unremarkable in appearance.  Other: No significant volume of ascites. No pneumoperitoneum.  Musculoskeletal: There are no aggressive appearing lytic or blastic lesions noted in the visualized  portions of the skeleton.  IMPRESSION: 1. Status post resection of bladder mass, with a small amount of sessile enhancement along the anterior bladder wall at site of resection. No definite residual mass identified. This enhancement is of uncertain etiology and significance, and may simply reflect some postoperative granulation tissue. Close attention on future  followup studies is recommended to ensure the stability or resolution of this finding. 2. No lymphadenopathy or other signs of metastatic disease noted in the chest, abdomen or pelvis. 3. Tiny clustered subpleural nodules in the posterior aspect of the right lower lobe measuring up to only 3 mm are unchanged in retrospect compared to prior study 07/04/2014. These findings are strongly favored to be benign, but attention on future followup studies is recommended. 4. Colonic diverticulosis without evidence to suggest acute diverticulitis at this time. 5. There are severe calcifications of the aortic valve. Echocardiographic correlation for evaluation of potential valvular dysfunction may be warranted if clinically indicated. 6. Mild diffuse bronchial wall thickening with mild centrilobular and paraseptal emphysema; imaging findings suggestive of underlying COPD.    Impression and Plan:   69 year old with the following issues:  1. 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 The patient is a status post transurethral resection of the bladder tumor on 07/24/2014 with the pathology revealed high-grade urothelial carcinoma with at least one portion involvement with small cell component. The clinical staging is T3 N0.   He is status post neoadjuvant chemotherapy with gemcitabine and cisplatin completed 3 cycles in 10/04/2014. CT scan images from October 23, 2014 did not show any evidence of disease outside the bladder. These results discussed with the patient and the family extensively. Options of treatments were discussed including a radical cystectomy which is the standard of care at this time. He is hesitant to proceed with surgery and I offered him an alternative although the success rate is less than radical cystectomy. The option would be chemoradiation to the bladder. I estimated the success rate of a  radical cystectomy of 60-65% long-term disease-free survival. Chemoradiation of the bladder might offer him close to 50% at best of long-term disease-free survival. Not having any definitive therapy to the bladder after neoadjuvant chemotherapy offers very little chance of success long-term given the high risk of relapse rate within the bladder and outside of the bladder.  He will consider all these options at this time and he has a urology follow-up in the end of January 2016. He also would like to get his medical oncology moving forward at [facility] which I have encouraged him to do so.  2. IV access: His peripheral veins have helped adequately without a Port-A-Cath insertion.  3. Anti-emetics:  No longer an issue after completing chemotherapy.  4. Renal insufficiency: creatinine have remained normal after completion of cisplatin.  5. Follow up: will be as needed as he chose to follow-up at [facility] closer to home.    Zola Button, MD 1/7/20164:24 PM

## 2014-10-29 ENCOUNTER — Ambulatory Visit: Payer: Self-pay | Admitting: Oncology

## 2014-10-29 ENCOUNTER — Encounter: Payer: Self-pay | Admitting: *Deleted

## 2014-10-29 NOTE — Progress Notes (Signed)
Request for progress notes and pathology given to [place] in medical records, to be faxed to dr Oliva Bustard in [place].

## 2014-10-31 ENCOUNTER — Telehealth: Payer: Self-pay | Admitting: Oncology

## 2014-10-31 NOTE — Telephone Encounter (Signed)
Faxed pt medical records to Dr. Oliva Bustard [phone]

## 2014-11-02 ENCOUNTER — Encounter: Payer: Self-pay | Admitting: Cardiovascular Disease

## 2014-11-02 ENCOUNTER — Ambulatory Visit (INDEPENDENT_AMBULATORY_CARE_PROVIDER_SITE_OTHER): Payer: Medicare HMO | Admitting: Cardiovascular Disease

## 2014-11-02 VITALS — BP 142/72 | HR 100 | Ht 69.0 in | Wt 192.5 lb

## 2014-11-02 DIAGNOSIS — Z8673 Personal history of transient ischemic attack (TIA), and cerebral infarction without residual deficits: Secondary | ICD-10-CM

## 2014-11-02 DIAGNOSIS — E785 Hyperlipidemia, unspecified: Secondary | ICD-10-CM

## 2014-11-02 DIAGNOSIS — I10 Essential (primary) hypertension: Secondary | ICD-10-CM | POA: Diagnosis not present

## 2014-11-02 DIAGNOSIS — I358 Other nonrheumatic aortic valve disorders: Secondary | ICD-10-CM

## 2014-11-02 DIAGNOSIS — R2681 Unsteadiness on feet: Secondary | ICD-10-CM

## 2014-11-02 DIAGNOSIS — Z01818 Encounter for other preprocedural examination: Secondary | ICD-10-CM | POA: Diagnosis not present

## 2014-11-02 DIAGNOSIS — I359 Nonrheumatic aortic valve disorder, unspecified: Secondary | ICD-10-CM

## 2014-11-02 DIAGNOSIS — C679 Malignant neoplasm of bladder, unspecified: Secondary | ICD-10-CM

## 2014-11-02 MED ORDER — ATORVASTATIN CALCIUM 40 MG PO TABS: 40.0000 mg | ORAL_TABLET | Freq: Every day | ORAL | Status: DC

## 2014-11-02 MED ORDER — CARVEDILOL 6.25 MG PO TABS
6.2500 mg | ORAL_TABLET | Freq: Two times a day (BID) | ORAL | Status: DC
Start: 2014-11-02 — End: 2014-12-18

## 2014-11-02 NOTE — Patient Instructions (Signed)
Your physician has recommended you make the following change in your medication:  Start Lipitor 40 mg once daily  Start Coreg 6.25 mg twice daily   Your physician recommends that you return for lab work in:  6-8 weeks for fasting Lipid and Liver Panel   Your physician has requested that you have an echocardiogram. Echocardiography is a painless test that uses sound waves to create images of your heart. It provides your doctor with information about the size and shape of your heart and how well your heart's chambers and valves are working. This procedure takes approximately one hour. There are no restrictions for this procedure.   Your physician recommends that you schedule a follow-up appointment in:  1 month with Dr. Fletcher Anon

## 2014-11-02 NOTE — Progress Notes (Signed)
Donald Decker, Donald Decker [id number]  Date:  11/02/2014   Patient ID:  Donald Decker, Donald Decker 1946/05/07, MRN [id number]   PCP:  Marden Noble, MD  Cardiologist:  Dr. Fletcher Anon, MD      HPI: 69 y.o. 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 He is also s/p chemotherapy with gemcitabine and cisplatin completed 3 cycles in 10/04/2014. He is noted to also have a history of TIA in 2013, HTN, and lower urinary tracy infections.   It was recommended that he undergo a radical cystectomy by Dr. Alen Blew on 10/25/2014 as part of his treatment plan after recently undergoing abdominal CT on 10/23/14 that did not show any evidence of disease outside the bladder. However he wanted to consider his options of this vs chemoradiation. He followed up at [facility] with Dr. Oliva Bustard who initially agreed that patient needed radical cystectomy. Patient was seen by radiation oncology the following day, with plans for repeat cystoscopy. If the repeat cystoscopy indicates no residual disease radiation oncology feels like he would be a candidate for bladder sparing treatment with chemoradiation. The patient has chosen to proceed with bladder sparing treatment. He does not wish to proceed with a radical cystectomy at this time. He will see Dr. Paulino Door in follow up on 1/18.   His CT of the abdomen did indicate severe calcifications of the aortic valve. Echo correlation was recommended. He denies any prior echos. No previous stress tests, or cardiac caths. He is minimally active at baseline. He does get dizzy at times. Sometimes this dizziness occurs when sitting, others when up and moving around. He has fallen down twice within the past 12 months. No head injuries or LOC.  When he has fallen he has fallen down to his side. Weight is up by 8 pounds since this fall. He has an increased appetite. No orthopnea. No early satiety. No lower extremity edema. Mild nonproductive cough at baseline (long term smoker of 25 pack years). No chest pain, SOB, palpitations, diaphoresis, nausea, vomiting, presyncope, or syncope.   His blood pressure at his recent outpatient visits has been somewhat elevated along with a high normal pulse.    Problem List:  Past Medical History  Diagnosis Date  . Bladder neoplasm   . Hypertension   . History of TIA (transient ischemic attack)     2013--  NO RESIDUAL  . GERD (gastroesophageal reflux disease)   . Lower urinary tract symptoms (LUTS)   . Bladder cancer    Past Surgical History  Procedure Laterality Date  . Cholecystectomy open  AGE 33  . Negative sleep study  2013  . Transurethral resection of bladder tumor N/A 07/24/2014    Procedure: TRANSURETHRAL RESECTION OF BLADDER TUMOR (TURBT) GREATER THAN 5 CMS;  Surgeon: Festus Aloe, MD;  Location: [facility];  Service: Urology;  Laterality: N/A;     Allergies:  Allergies  Allergen Reactions  . Latex Other (See Comments)    BLISTERING/ BRUISEING  . Tagamet [Cimetidine] Hives      Home Medications:  Prior to Admission medications   Medication Sig Start Date End Date Taking? Authorizing Provider  aspirin EC 81 MG tablet Take 81 mg by mouth daily.   Yes Historical Provider, MD  diazepam (  VALIUM) 5 MG tablet Take 5 mg by mouth daily. 07/11/14  Yes Historical Provider, MD  losartan-hydrochlorothiazide (HYZAAR) 100-25 MG per tablet Take 1 tablet by mouth every evening.   Yes Historical Provider, MD  Multiple Vitamins-Minerals (MULTIVITAMIN ADULTS 50+ PO) Take 1 tablet by mouth daily.   Yes Historical Provider, MD  oxybutynin (DITROPAN) 5 MG tablet Take 1 tablet (5 mg total) by mouth every 8 (eight) hours as needed for bladder spasms. 09/12/14  Yes Wyatt Portela, MD  pantoprazole (PROTONIX) 40 MG tablet Take 40 mg by mouth every evening.   Yes Historical Provider, MD  prochlorperazine (COMPAZINE) 10 MG tablet Take 1 tablet (10 mg total) by mouth every 6 (six) hours as needed for nausea or vomiting. 09/27/14  Yes Adrena E Johnson, PA-C  sertraline (ZOLOFT) 100 MG tablet Take 100 mg by mouth every evening.   Yes Historical Provider, MD     Family History:  The patient's family history includes Heart Problems in his mother; Stroke in his mother.    Social History:  The patient  reports that he has been smoking Cigarettes.  He has a 25 pack-year smoking history. He has never used smokeless tobacco. He reports that he drinks about 4.2 oz of alcohol per week. He reports that he does not use illicit drugs.   Recent Labs: 10/23/2014: ALT 14; Creatinine 1.1; Hemoglobin 12.2*; Potassium 5.4*   Weights: Filed Weights   11/02/14 1504  Weight: 192 lb 8 oz (87.317 kg)       ROS:  Please see the history of present illness. All other systems reviewed and negative.    PHYSICAL EXAM:  VS:  Ht 5\' 9"  (1.753 m)  Wt 192 lb 8 oz (87.317 kg)  BMI 28.41 kg/m2 Well nourished, well developed, in no acute distress HEENT: normal Neck: no JVD Cardiac:  normal S1, S2; RRR; no murmur, crisp valve sounds ausculation at RUSB  Lungs:  clear to auscultation bilaterally, no wheezing, rhonchi or rales Abd: soft, nontender, no hepatomegaly Ext: no edema Skin: warm and dry Neuro:  moves all extremities spontaneously, no focal abnormalities noted  EKG:  NSR, 100 bpm, no st/t changes      ASSESSMENT AND PLAN:  1. Aortic calcifications: -Check echo to evaluate valvular function and to determine if this is playing a role in his dizziness and instability   2. Bladder cancer: -Patient prefers to proceed with chemoradiation therapy only at this time and not radical cystectomy  -He is ok to proceed with chemoradiation, however should he decided to proceed with  radical cystectomy or should it become apparent that there are now no other options he will require cardiac clearance with Lexiscan Myoview as he is at moderate risk for non-cardiac surgery and has not had prior ischemic evaluation   3. HTN: -Add Coreg 6.25 mg bid -Continue Hyzaar 100/25 mg daily   3. Primary prevention: -Add Lipitor 40 mg daily -Check HFP and FLP in 6-8 weeks -Continue aspirin 81 mg daily   4. Gait instability/history of TIA: -He could benefit from PT evaluation, PCP to set up   5. Abnormal abdominal CT: -Ordering provider to follow up with radiology follow up recs    6. Dispo: -Follow up 1 month with Dr. Okey Regal, PA-C 11/02/2014, 3:08 PM

## 2014-11-08 ENCOUNTER — Ambulatory Visit: Payer: Self-pay | Admitting: Urology

## 2014-11-08 LAB — POTASSIUM: POTASSIUM: 3.8 mmol/L (ref 3.5–5.1)

## 2014-11-12 ENCOUNTER — Other Ambulatory Visit: Payer: Medicare HMO

## 2014-11-14 ENCOUNTER — Ambulatory Visit: Payer: Self-pay | Admitting: Urology

## 2014-11-19 ENCOUNTER — Ambulatory Visit: Payer: Self-pay | Admitting: Oncology

## 2014-11-29 ENCOUNTER — Other Ambulatory Visit (INDEPENDENT_AMBULATORY_CARE_PROVIDER_SITE_OTHER): Payer: Medicare HMO

## 2014-11-29 DIAGNOSIS — E785 Hyperlipidemia, unspecified: Secondary | ICD-10-CM

## 2014-11-30 LAB — HEPATIC FUNCTION PANEL
ALBUMIN: 4.2 g/dL (ref 3.6–4.8)
ALT: 10 IU/L (ref 0–44)
AST: 14 IU/L (ref 0–40)
Alkaline Phosphatase: 64 IU/L (ref 39–117)
Bilirubin Total: 0.2 mg/dL (ref 0.0–1.2)
Bilirubin, Direct: 0.08 mg/dL (ref 0.00–0.40)
TOTAL PROTEIN: 6.5 g/dL (ref 6.0–8.5)

## 2014-11-30 LAB — LIPID PANEL
CHOL/HDL RATIO: 4.3 ratio (ref 0.0–5.0)
Cholesterol, Total: 187 mg/dL (ref 100–199)
HDL: 43 mg/dL (ref >39)
LDL Calculated: 84 mg/dL (ref 0–99)
Triglycerides: 301 mg/dL — ABNORMAL HIGH (ref 0–149)
VLDL CHOLESTEROL CAL: 60 mg/dL — AB (ref 5–40)

## 2014-12-03 ENCOUNTER — Ambulatory Visit: Payer: Medicare HMO | Admitting: Cardiovascular Disease

## 2014-12-06 ENCOUNTER — Ambulatory Visit: Payer: Self-pay | Admitting: Radiation Oncology

## 2014-12-10 ENCOUNTER — Other Ambulatory Visit: Payer: Medicare HMO

## 2014-12-13 ENCOUNTER — Other Ambulatory Visit (INDEPENDENT_AMBULATORY_CARE_PROVIDER_SITE_OTHER): Payer: Medicare HMO

## 2014-12-13 ENCOUNTER — Other Ambulatory Visit: Payer: Self-pay

## 2014-12-13 DIAGNOSIS — I358 Other nonrheumatic aortic valve disorders: Secondary | ICD-10-CM

## 2014-12-18 ENCOUNTER — Ambulatory Visit (INDEPENDENT_AMBULATORY_CARE_PROVIDER_SITE_OTHER): Payer: Medicare HMO | Admitting: Cardiovascular Disease

## 2014-12-18 ENCOUNTER — Ambulatory Visit
Admit: 2014-12-18 | Disposition: A | Discharge status: Home / Self Care | Payer: Self-pay | Attending: Oncology | Admitting: Oncology

## 2014-12-18 ENCOUNTER — Encounter: Payer: Self-pay | Admitting: Cardiovascular Disease

## 2014-12-18 VITALS — BP 138/88 | HR 74 | Ht 69.0 in | Wt 192.2 lb

## 2014-12-18 DIAGNOSIS — E785 Hyperlipidemia, unspecified: Secondary | ICD-10-CM

## 2014-12-18 DIAGNOSIS — I359 Nonrheumatic aortic valve disorder, unspecified: Secondary | ICD-10-CM

## 2014-12-18 DIAGNOSIS — I10 Essential (primary) hypertension: Secondary | ICD-10-CM

## 2014-12-18 MED ORDER — LOSARTAN POTASSIUM-HCTZ 100-25 MG PO TABS: 1.0000 | ORAL_TABLET | Freq: Every evening | ORAL | Status: DC

## 2014-12-18 MED ORDER — PANTOPRAZOLE SODIUM 40 MG PO TBEC: 40.0000 mg | DELAYED_RELEASE_TABLET | Freq: Every evening | ORAL | Status: DC

## 2014-12-18 NOTE — Patient Instructions (Signed)
You can take 2-3 grams of fish oil daily to help with elevated Triglyceride.   Continue other medications.   Follow up as needed.

## 2014-12-23 DIAGNOSIS — E785 Hyperlipidemia, unspecified: Secondary | ICD-10-CM | POA: Insufficient documentation

## 2014-12-23 NOTE — Assessment & Plan Note (Signed)
Echocardiogram showed no significant stenosis or regurgitation. Aortic valve was moderately calcified. No further workup is needed for this. He currently has no cardiac symptoms.

## 2014-12-23 NOTE — Assessment & Plan Note (Signed)
He has not felt carvedilol which does not seem to be needed at the present time. The pressure is reasonably controlled on losartan-hydrochlorothiazide.

## 2014-12-23 NOTE — Assessment & Plan Note (Signed)
Lab Results  Component Value Date   CHOL 187 11/29/2014   HDL 43 11/29/2014   LDLCALC 84 11/29/2014   TRIG 301* 11/29/2014   CHOLHDL 4.3 11/29/2014   LDL was less than 100. Treatment with a statin is not indicated. Triglyceride was elevated at 301. I recommended 3 g of fish oil daily as well as diet modifications.

## 2014-12-23 NOTE — Progress Notes (Signed)
HPI  This is a 69 year old man who is here today for a follow-up visit. He was seen in January by Christell Faith for aortic valve calcifications noted on CT scan. He has known history of bladder cancer currently treated with chemotherapy. There was possible plans for surgery but that is no longer the case. The patient complained of no cardiac symptoms. He had an echocardiogram done which showed normal LV systolic function, grade 1 diastolic dysfunction, moderately calcified aortic valve leaflets with no evidence of aortic stenosis or regurgitation.  Allergies  Allergen Reactions  . Latex Other (See Comments)    BLISTERING/ BRUISEING  . Tagamet [Cimetidine] Hives     Current Outpatient Prescriptions on File Prior to Visit  Medication Sig Dispense Refill  . aspirin EC 81 MG tablet Take 81 mg by mouth daily.    . diazepam (VALIUM) 5 MG tablet Take 5 mg by mouth daily.  5  . Multiple Vitamins-Minerals (MULTIVITAMIN ADULTS 50+ PO) Take 1 tablet by mouth daily.    Marland Kitchen oxybutynin (DITROPAN) 5 MG tablet Take 1 tablet (5 mg total) by mouth every 8 (eight) hours as needed for bladder spasms. 30 tablet 3  . prochlorperazine (COMPAZINE) 10 MG tablet Take 1 tablet (10 mg total) by mouth every 6 (six) hours as needed for nausea or vomiting. 30 tablet 0  . sertraline (ZOLOFT) 100 MG tablet Take 100 mg by mouth every evening.     No current facility-administered medications on file prior to visit.     Past Medical History  Diagnosis Date  . Bladder neoplasm   . Hypertension   . History of TIA (transient ischemic attack)     2013--  NO RESIDUAL  . GERD (gastroesophageal reflux disease)   . Lower urinary tract symptoms (LUTS)   . Bladder cancer   . Aortic valve calcification      Past Surgical History  Procedure Laterality Date  . Cholecystectomy open  AGE 81  . Negative sleep study  2013  . Transurethral resection of bladder tumor N/A 07/24/2014    Procedure: TRANSURETHRAL RESECTION OF BLADDER  TUMOR (TURBT) GREATER THAN 5 CMS;  Surgeon: Festus Aloe, MD;  Location: [facility];  Service: Urology;  Laterality: N/A;     Family History  Problem Relation Age of Onset  . Heart Problems Mother   . Stroke Mother      History   Social History  . Marital Status: Married    Spouse Name: N/A  . Number of Children: N/A  . Years of Education: N/A   Occupational History  . Not on file.   Social History Main Topics  . Smoking status: Current Every Day Smoker -- 0.50 packs/day for 50 years    Types: Cigarettes  . Smokeless tobacco: Never Used  . Alcohol Use: 4.2 oz/week    7 Cans of beer per week     Comment: DAILY BEER  . Drug Use: No  . Sexual Activity: Not on file   Other Topics Concern  . Not on file   Social History Narrative      PHYSICAL EXAM   BP 138/88 mmHg  Pulse 74  Ht 5\' 9"  (1.753 m)  Wt 192 lb 4 oz (87.204 kg)  BMI 28.38 kg/m2 Constitutional: He is oriented to person, place, and time. He appears well-developed and well-nourished. No distress.  HENT: No nasal discharge.  Head: Normocephalic and atraumatic.  Eyes: Pupils are equal and round.  No discharge. Neck: Normal range  of motion. Neck supple. No JVD present. No thyromegaly present.  Cardiovascular: Normal rate, regular rhythm, normal heart sounds. Exam reveals no gallop and no friction rub. No murmur heard.  Pulmonary/Chest: Effort normal and breath sounds normal. No stridor. No respiratory distress. He has no wheezes. He has no rales. He exhibits no tenderness.  Abdominal: Soft. Bowel sounds are normal. He exhibits no distension. There is no tenderness. There is no rebound and no guarding.  Musculoskeletal: Normal range of motion. He exhibits no edema and no tenderness.  Neurological: He is alert and oriented to person, place, and time. Coordination normal.  Skin: Skin is warm and dry. No rash noted. He is not diaphoretic. No erythema. No pallor.  Psychiatric: He has a normal  mood and affect. His behavior is normal. Judgment and thought content normal.        ASSESSMENT AND PLAN

## 2015-01-10 ENCOUNTER — Other Ambulatory Visit: Payer: Self-pay | Admitting: Oncology

## 2015-01-17 LAB — CBC CANCER CENTER
Basophil #: 0 x10 3/mm (ref 0.0–0.1)
Basophil %: 0.6 %
EOS PCT: 1.7 %
Eosinophil #: 0.1 x10 3/mm (ref 0.0–0.7)
HCT: 41 % (ref 40.0–52.0)
HGB: 13.9 g/dL (ref 13.0–18.0)
LYMPHS PCT: 12.6 %
Lymphocyte #: 0.6 x10 3/mm — ABNORMAL LOW (ref 1.0–3.6)
MCH: 30.6 pg (ref 26.0–34.0)
MCHC: 33.9 g/dL (ref 32.0–36.0)
MCV: 90 fL (ref 80–100)
Monocyte #: 0.5 x10 3/mm (ref 0.2–1.0)
Monocyte %: 11.2 %
NEUTROS ABS: 3.5 x10 3/mm (ref 1.4–6.5)
NEUTROS PCT: 73.9 %
Platelet: 169 x10 3/mm (ref 150–440)
RBC: 4.54 10*6/uL (ref 4.40–5.90)
RDW: 12.7 % (ref 11.5–14.5)
WBC: 4.7 x10 3/mm (ref 3.8–10.6)

## 2015-01-17 LAB — BASIC METABOLIC PANEL
ANION GAP: 5 — AB (ref 7–16)
BUN: 14 mg/dL
CALCIUM: 9.3 mg/dL
Chloride: 97 mmol/L — ABNORMAL LOW
Co2: 27 mmol/L
Creatinine: 1.05 mg/dL
EGFR (African American): >60
EGFR (Non-African Amer.): >60
Glucose: 111 mg/dL — ABNORMAL HIGH
POTASSIUM: 4.1 mmol/L
Sodium: 129 mmol/L — ABNORMAL LOW

## 2015-01-17 LAB — CREATINE: CREATININE: 1.05

## 2015-01-17 LAB — MAGNESIUM: MAGNESIUM: 2.1 mg/dL

## 2015-01-18 ENCOUNTER — Ambulatory Visit
Admit: 2015-01-18 | Disposition: A | Discharge status: Home / Self Care | Payer: Self-pay | Attending: Oncology | Admitting: Oncology

## 2015-01-24 LAB — CBC CANCER CENTER
BASOS ABS: 0 x10 3/mm (ref 0.0–0.1)
Basophil %: 0.4 %
EOS ABS: 0.1 x10 3/mm (ref 0.0–0.7)
Eosinophil %: 1.6 %
HCT: 39.8 % — AB (ref 40.0–52.0)
HGB: 13.8 g/dL (ref 13.0–18.0)
LYMPHS PCT: 9.4 %
Lymphocyte #: 0.4 x10 3/mm — ABNORMAL LOW (ref 1.0–3.6)
MCH: 30.7 pg (ref 26.0–34.0)
MCHC: 34.6 g/dL (ref 32.0–36.0)
MCV: 89 fL (ref 80–100)
Monocyte #: 0.4 x10 3/mm (ref 0.2–1.0)
Monocyte %: 11.5 %
Neutrophil #: 2.9 x10 3/mm (ref 1.4–6.5)
Neutrophil %: 77.1 %
PLATELETS: 181 x10 3/mm (ref 150–440)
RBC: 4.5 10*6/uL (ref 4.40–5.90)
RDW: 13 % (ref 11.5–14.5)
WBC: 3.8 x10 3/mm (ref 3.8–10.6)

## 2015-01-24 LAB — COMPREHENSIVE METABOLIC PANEL
ALK PHOS: 53 U/L
ALT: 14 U/L — AB
AST: 16 U/L
Albumin: 4.3 g/dL
Anion Gap: 6 — ABNORMAL LOW (ref 7–16)
BILIRUBIN TOTAL: 0.5 mg/dL
BUN: 16 mg/dL
CALCIUM: 9.2 mg/dL
CHLORIDE: 93 mmol/L — AB
CO2: 28 mmol/L
CREATININE: 1.03 mg/dL
EGFR (African American): >60
EGFR (Non-African Amer.): >60
GLUCOSE: 106 mg/dL — AB
Potassium: 4.8 mmol/L
SODIUM: 127 mmol/L — AB
Total Protein: 7.6 g/dL

## 2015-01-24 LAB — MAGNESIUM: MAGNESIUM: 2.1 mg/dL

## 2015-01-31 LAB — CBC CANCER CENTER
Basophil #: 0 x10 3/mm (ref 0.0–0.1)
Basophil %: 0.4 %
EOS PCT: 0.7 %
Eosinophil #: 0 x10 3/mm (ref 0.0–0.7)
HCT: 36.8 % — ABNORMAL LOW (ref 40.0–52.0)
HGB: 12.7 g/dL — ABNORMAL LOW (ref 13.0–18.0)
Lymphocyte #: 0.5 x10 3/mm — ABNORMAL LOW (ref 1.0–3.6)
Lymphocyte %: 7 %
MCH: 30.6 pg (ref 26.0–34.0)
MCHC: 34.6 g/dL (ref 32.0–36.0)
MCV: 89 fL (ref 80–100)
MONOS PCT: 7.5 %
Monocyte #: 0.5 x10 3/mm (ref 0.2–1.0)
NEUTROS PCT: 84.4 %
Neutrophil #: 5.5 x10 3/mm (ref 1.4–6.5)
PLATELETS: 164 x10 3/mm (ref 150–440)
RBC: 4.16 10*6/uL — AB (ref 4.40–5.90)
RDW: 13 % (ref 11.5–14.5)
WBC: 6.6 x10 3/mm (ref 3.8–10.6)

## 2015-01-31 LAB — BASIC METABOLIC PANEL
Anion Gap: 5 — ABNORMAL LOW (ref 7–16)
BUN: 15 mg/dL
Calcium, Total: 9.2 mg/dL
Chloride: 91 mmol/L — ABNORMAL LOW
Co2: 28 mmol/L
Creatinine: 1.09 mg/dL
EGFR (African American): >60
Glucose: 111 mg/dL — ABNORMAL HIGH
POTASSIUM: 4 mmol/L
SODIUM: 124 mmol/L — AB

## 2015-02-05 NOTE — Discharge Summary (Signed)
PATIENT NAME:  Donald Decker, Donald Decker MR#:  [id number] DATE OF BIRTH:  1946/01/30  DATE OF ADMISSION:  07/05/2012 DATE OF DISCHARGE:  07/05/2012  ADMITTING PHYSICIAN: Donald Hong, MD   DISCHARGING PHYSICIAN:  Donald Lighter, MD   PRIMARY CARE PHYSICIAN: Donald Pugh, MD    HOSPITAL CONSULTATIONS:  None.    DISCHARGE DIAGNOSES:  1. 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  2. Reactive airway disease.  3. Tobacco use disorder.  4. Hypertension.  5. Hyponatremia.  6. Depression.   DISCHARGE MEDICATIONS: 1. Aspirin 81 mg p.o. daily.  2. Zoloft 100 mg p.o. daily.  3. Losartan 100 mg p.o. daily.  4. Pravachol 20 mg p.o. at bedtime.  5. Symbicort 160 x 4.5 mcg inhalation, 2 puffs b.i.d.  6. Prednisone 60 mg p.o. daily and taper off  x10 mg every day.    DISCHARGE DIET: Low-sodium diet.   DISCHARGE ACTIVITY: As tolerated.   FOLLOW-UP INSTRUCTIONS: Primary care physician follow-up in 1 to 3 weeks for blood pressure management.   LABORATORY, DIAGNOSTIC AND RADIOLOGICAL DATA: WBC 8.2, hemoglobin 15.3, hematocrit 42.3, platelet count 259. Sodium 130, potassium 4.1, chloride 95, bicarbonate 29, BUN 10, creatinine 0.98, glucose 121, calcium 8.8. ALT 23, AST 14, alkaline phosphatase 67, total bilirubin 0.3, albumin of 3.7. Alcohol level is negative. Troponins x3 are negative. Chest x-ray showed bilateral interstitial opacities, possibility of edema or interstitial lung disease. CT of the head shows only chronic involutional changes without acute abnormalities. Urinalysis is negative for any infection. Ultrasound Doppler of carotids bilaterally showed no significant stenosis. MRI of the brain showed acute ischemia posterior limb of internal capsule,  and there were changes consistent with chronic white matter ischemia and right frontal sinus mucosal thickening, correlate clinically, possible sinusitis.   BRIEF HOSPITAL COURSE: Donald Decker is a  69 year old Caucasian male with a past medical history significant for hypertension, hyperlipidemia and smoking, presented yesterday with some slurred speech, unsteady gait and right-sided facial droop.   1. Acute cerebrovascular accident: His symptoms have completely resolved. His ultrasound Dopplers were negative. The patient wanted to go home because he was feeling better, but I persuaded him to stay in the hospital at least to get the MRI done because of his risk factors, and the MRI did show that he has an acute posterior limb infarct on the right side of his internal capsule. Thankfully, all his symptoms are resolved.  He did work with Physical Therapy and did well, independently walking and also doing the stairs; and so he is not eligible for requiring any other rehab services at this time. He was not taking any anticoagulation at home, so he was started on aspirin and a statin was also started. His blood pressure needs to be better controlled, but his HCTZ is stopped due to hyponatremia; and he is being sent home on losartan 100 mg every day and advised to take it. I do not want to drop his blood pressure too rapidly with his acute infarct, so I advised him to follow up with his PCP in the next week so that he can have some adjustment to his blood pressure medications, if needed. He was also strongly advised to stop smoking.  2. Reactive airway disease: He had significant wheezing on my exam this morning. I am not sure if he has underlying chronic obstructive pulmonary disease, which he could from his smoking, and a chest x-ray showed some interstitial pattern. However, one dose of Solu-Medrol with some nebulizer  treatments have completely cleared the wheezing, so he is being discharged on a prednisone taper  3. Hyponatremia: Probably from being on HCTZ, so we will hold it at this time and otherwise to continue losartan and drink fluids.   4. Tobacco use disorder: He was strongly counseled against it,  and he said he might use an electronic cigarette from now on.   His course has been otherwise uneventful in the hospital.  DISCHARGE CONDITION: Stable.   DISCHARGE DISPOSITION: Home.   TIME SPENT ON DISCHARGE: 45 minutes.  ____________________________ Donald Lighter, MD rk:cbb D: 07/05/2012 16:50:42 ET T: 07/06/2012 11:43:05 ET JOB#: [id number]  cc: Donald Lighter, MD, <Dictator> Donald Decker. Brynda Greathouse, MD Donald Lighter MD ELECTRONICALLY SIGNED 07/08/2012 17:05

## 2015-02-05 NOTE — H&P (Signed)
PATIENT NAME:  Donald Decker, Donald Decker MR#:  [id number] DATE OF BIRTH:  01-06-1946  DATE OF ADMISSION:  07/05/2012  PRIMARY CARE PHYSICIAN: Dr. Brynda Greathouse   ER PHYSICIAN: Dr. Dahlia Client   ADMITTING PHYSICIAN: Dr. Pearletha Furl    PRESENTING COMPLAINT: Fascial droop.   HISTORY OF PRESENT ILLNESS: The patient is a 69 year old male who was brought to the hospital today by the family after he was noted to be having some slurred speech, unsteady gait, and right facial droop. The patient had all day long been painting the house, was not using respiratory accessory muscles during the episode. When the wife called on the phone she noted he was having slurred speech. Later on seeing the patient she noted a right facial droop and the last time the patient was noted to be in good health was around 12 hours prior to this episode. With some unsteady gait this evening, he was brought to the Emergency Room for further evaluation. Work-up so far included a CT of the head which showed no acute abnormality. He was referred to the hospitalist for further evaluation. EKG showed normal sinus rhythm, rate of 87, no acute ST wave changes. The patient denies any chest pain, PND, orthopnea, pedal edema, or recent medication change. He admits to history of hypertension, however, has not been on his medication over the last three days. For this he was referred to the hospitalist.   REVIEW OF SYSTEMS: CONSTITUTIONAL: Positive for fatigue. No weight loss or weight gain. No fever. EYES: No blurred vision, redness, discharge. ENT: No tinnitus, epistaxis, or difficulty swallowing. RESPIRATORY: Positive for cough and some wheezing. CARDIOVASCULAR: Denies any chest pain. No PND, orthopnea, pedal edema, or palpitations. GI: No nausea, vomiting, diarrhea, abdominal pain, or change in bowel habits. GU: No dysuria, frequency, or incontinence. ENDOCRINE: No polyuria, polydipsia, heat or cold intolerance. HEMATOLOGIC: No anemia, easy bruising, bleeding, or swollen  glands. SKIN: No rashes, change in hair or skin texture. MUSCULOSKELETAL: No joint pain, redness, swelling, or limited activity. NEUROLOGIC: No numbness, tremors, headaches, or seizures. PSYCH: No anxiety or depression.    PAST MEDICAL HISTORY:  1. Hypertension.  2. History of depression.   PAST SURGICAL HISTORY: Cholecystectomy.   SOCIAL HISTORY: Lives at home with his wife. Active cigarette smoker, 1/2 to 1 pack per day. Alcohol user, drinks about one bottle of beer per day. Denies any other recreational drug use. The patient is a retired Radiographer, therapeutic.   FAMILY HISTORY: Positive for CVA, coronary artery disease, diabetes, and hypertension in the mother.   ALLERGIES: Tagamet gives him hives.   MEDICATIONS:  1. Zoloft 100 mg once daily. 2. Hyzaar 100/25 mg 1 tablet daily.   PHYSICAL EXAMINATION:   VITAL SIGNS: Temperature 97.7, pulse 92, respiratory rate 18, blood pressure 163/84, sats 99 on room air.   GENERAL: Elderly, well built, well nourished gentleman lying on the gurney awake, alert, oriented to time, place, and person in no distress.   HEENT: Atraumatic, normocephalic. Pupils equal, reactive to light and accommodation. Extraocular movements intact. Mucous membranes pink, moist.   NECK: Supple. No JV distention.   CHEST: Good air entry. Clear to auscultation.   HEART: Regular rate and rhythm. No murmur.   ABDOMEN: Full, moves with respiration, nontender. Bowel sounds normoactive. No organomegaly.   EXTREMITIES: No edema. No clubbing. No deformity. Peripheral pulses palpable.   NEUROLOGICAL: Cranial nerves II through XII grossly intact. No focal motor or sensory deficits.   PSYCH: Affect appropriate to situation.   LABORATORY, DIAGNOSTIC,  AND RADIOLOGICAL DATA: EKG showed normal sinus rhythm, rate of 87, no acute ST wave changes.  CT head showed no acute intracranial abnormality.   CBC unremarkable. White count 8, hemoglobin 15, platelets 259. Chemistry shows low  sodium of 130. No prior labs for comparison. Potassium 4.1, bicarb 29, creatinine 0.9, glucose 121, calcium 8.8. INR 0.9. Alcohol level is undetectable. Normal LFTs. Urinalysis is negative.  IMPRESSION: 1. TIA, consider encephalopathy secondary to the paint fumes the patient was using.  2. Hypertension, poorly controlled, likely reactive.  3. Gastroesophageal reflux disease.  4. Depression, stable.  5. Reactive airway disease, likely COPD.  6. Hyponatremia most likely secondary to diuretic use, to consider SIADH in view of his respiratory symptoms.   PLAN:  1. Admit to general medical floor under observation for serial cardiac enzymes, fasting lipid profile, TSH. Check magnesium. Check urine sodium and osmolality.  2. Get echocardiogram and carotid Doppler in a.m.  3. Permissive blood pressure tonight.  4. Respiratory support with oxygen, nebulizer, steroid therapy, and antibiotics.  5. GI prophylaxis with Protonix.  6. DVT prophylaxis with Lovenox.   CODE STATUS: FULL CODE.      TOTAL PATIENT CARE TIME: 50 minutes.   ____________________________ Jules Husbands Pearletha Furl, MD mia:drc D: 07/05/2012 02:26:08 ET T: 07/05/2012 07:59:08 ET JOB#: [id number]  cc: Tylah Mancillas I. Pearletha Furl, MD, <Dictator> [name]. Brynda Greathouse, MD Carola Frost MD ELECTRONICALLY SIGNED 07/05/2012 23:50

## 2015-02-07 LAB — CBC CANCER CENTER
BASOS ABS: 0 x10 3/mm (ref 0.0–0.1)
Basophil %: 0.9 %
Eosinophil #: 0.1 x10 3/mm (ref 0.0–0.7)
Eosinophil %: 1.3 %
HCT: 36.2 % — AB (ref 40.0–52.0)
HGB: 12.5 g/dL — ABNORMAL LOW (ref 13.0–18.0)
LYMPHS ABS: 0.6 x10 3/mm — AB (ref 1.0–3.6)
LYMPHS PCT: 13.6 %
MCH: 30.3 pg (ref 26.0–34.0)
MCHC: 34.5 g/dL (ref 32.0–36.0)
MCV: 88 fL (ref 80–100)
Monocyte #: 0.4 x10 3/mm (ref 0.2–1.0)
Monocyte %: 9.8 %
NEUTROS ABS: 3 x10 3/mm (ref 1.4–6.5)
Neutrophil %: 74.4 %
Platelet: 167 x10 3/mm (ref 150–440)
RBC: 4.12 10*6/uL — ABNORMAL LOW (ref 4.40–5.90)
RDW: 13.4 % (ref 11.5–14.5)
WBC: 4.1 x10 3/mm (ref 3.8–10.6)

## 2015-02-07 LAB — COMPREHENSIVE METABOLIC PANEL
ALK PHOS: 47 U/L
Albumin: 4 g/dL
Anion Gap: 7 (ref 7–16)
BUN: 17 mg/dL
Bilirubin,Total: 0.3 mg/dL
CO2: 26 mmol/L
CREATININE: 0.99 mg/dL
Calcium, Total: 9 mg/dL
Chloride: 90 mmol/L — ABNORMAL LOW
EGFR (African American): >60
EGFR (Non-African Amer.): >60
Glucose: 107 mg/dL — ABNORMAL HIGH
POTASSIUM: 4.7 mmol/L
SGOT(AST): 14 U/L — ABNORMAL LOW
SGPT (ALT): 14 U/L — ABNORMAL LOW
SODIUM: 123 mmol/L — AB
Total Protein: 7.3 g/dL

## 2015-02-07 LAB — MAGNESIUM: MAGNESIUM: 1.8 mg/dL

## 2015-02-08 NOTE — Discharge Summary (Signed)
PATIENT NAME:  Donald Decker, Donald Decker MR#:  [id number] DATE OF BIRTH:  10-31-1945  DATE OF ADMISSION:  07/09/2013 DATE OF DISCHARGE:  07/10/2013  ADMITTING DIAGNOSIS: Weakness, presyncope.  DISCHARGE DIAGNOSES:  1.  Generalized weakness after syncope.  2.  Suspected mild dehydration.  3.  Hyponatremia, possibly hydrochlorothiazide as well as alcohol related.  4.  Elevated troponin, no acute coronary syndrome or myocardial infarction but likely demand ischemia.  5.  Acute bronchitis.  6.  Left internal carotid artery stenosis at 59% per Doppler ultrasound. 7.  History of hypertension.  8.  Cerebrovascular accident. 9.  Alcohol, tobacco abuse, ongoing.  10.  Depression.   DISCHARGE CONDITION: Stable.   DISCHARGE MEDICATIONS: The patient is to resume aspirin 81 mg p.o. daily, potassium/formoterol 160/4.5, 2 puffs twice daily, sertraline 100 mg p.o. daily, fish oil 1 gram 2 capsules once daily, Protonix 40 mg daily, losartan 100 mg daily, thiamine 100 mg daily, Zithromax 250 mg p.o. daily for 4 more days, Combivent Respimat 1 puff 4 times daily as needed.   The patient was advised not to take HCTZ/losartan combination, which is new prescription. losartan unless recommended by primary care physician.   ACTIVITY:  Home health physical therapy at 2 to 7 times a week. Activity limitations as tolerated.  DIET   2 grams salt, low fat, low cholesterol, regular consistency.    FOLLOW-UP APPOINTMENT: With Dr. Clayborn Bigness in 2 days after discharge. Dr. Lucky Cowboy or Dr. Delana Meyer in 1 week after discharge, also Dr. Brynda Greathouse in 2 days after discharge.    CONSULTANTS: Dr. Clayborn Bigness.   RADIOLOGIC STUDIES: Chest x-ray, portable single view09/21/2014 showed no evidence of acute cardiopulmonary disease as compared to prior x-ray.  CT of the head without 07/09/2013 revealed no acute intracranial process as well. Ultrasound of carotid arteries bilaterally 07/10/2013 revealed atherosclerotic disease present, 59% stenosis in the  left internal carotid artery is measured.  The ratios and peak systolic velocities do not suggest hemodynamically significant stenosis. CT is available for further evaluation if desired, according to the radiologist. Echocardiogram on the 07/10/2013 revealed left ventricular ejection fraction by visual estimation of 65% to 70%, normal global left ventricular systolic function.   HISTORY AND PHYSICAL:  The patient is a 69 year old Caucasian male with history of alcohol, tobacco abuse, ongoing, who presents to the hospital with complaints of generalized weakness as well as fall to the floor. According to medical records and Dr. Rosanne Sack and P, the patient came in with a few-day history of generalized weakness and inability to even move around due to generalized weakness. On arrival to the hospital, the patient's temperature is 97.7, pulse was 73, blood pressure 140/80, respiration rate was 18, O2 sats were 97% on room air. Physical exam was unremarkable.   LABORATORY AND DIAGNOSTIC DATA: The patient's lab data done in the Emergency Room initially 07/09/2013 showed elevation of creatinine to 1.33, sodium 125, glucose 104, otherwise BMP was unremarkable. The patient's liver enzymes were normal. Cardiac enzymes, first set of troponins, showed elevation of 0.14, second set elevation of troponin 1.1., third set elevation of troponin to 0.63, MB fraction as well as CK totals were within normal limits. TSH was normal at 3.87. CBC was normal. Urinalysis was unremarkable. EKG showed normal sinus rhythm at 88 minutes. No acute ST-T changes were noted   HOSPITAL COURSE: The patient was admitted to the hospital for further evaluation. Because of his hyponatremia, it was felt that the patient could have been experiencing some element of dehydration,  so IV fluids were initiated as well as physical therapy. He did satisfactory with physical therapy; however, he was recommended to return to home with home health physical  therapy.  His presyncopal episode was also investigated. Orthostatic vital signs were performed while he was in the hospital, and those were negative. He underwent carotid ultrasound testing which showed 59% stenosis on the left, and he was recommended to continue aspirin therapy as well as follow up with Vascular Surgery in the next 1 week after discharge for further management and evaluation. He had echocardiogram done which was unremarkable as well as cardiology consultation with Dr. Clayborn Bigness.  Dr. Clayborn Bigness felt the patient may benefit from stress testing which can be done as outpatient. The patient is being discharged home with follow-up with Dr. Clayborn Bigness in the next 1 to 2 days after discharge for recommendations for outpatient stress testing.   In regards to hyponatremia, it was felt that it could have been hypovolemia due to some element of dehydration as well as possibly potomania due to alcohol abuse and likely HCTZ use.  The patient's EKG  was placed on hold and actually discontinued.  The patient was given IV fluids after which the patient's sodium level had a little improved to 126. The patient's TSH was checked and was found to be within normal limits. The patient was recommended to hold HCTZ until his primary care physician's recommendations and to have his sodium level evaluated as outpatient in the next few days after discharge to show improvement. He was also advised not to drink any alcohol.   In regards to elevated troponin, echocardiogram was performed and it did not show any significant abnormalities. Cardiology consultation was obtained, and the cardiologist felt that the patient would benefit from stress testing as an outpatient, which will be arranged for him upon discharge. It was felt that the patient did not have acute coronary syndrome or MI but very likely demand ischemia.   The patient was complaining of cough, no significant sputum production or white sputum production. He was  felt to have mild bronchitis for which Zithromax was initiated as well as Combivent.  The patient is to follow up with his primary care physician for management of his bronchitis. Sputum cultures were ordered, however, obtained during his stay.   In regards to a left ICA stenosis of 59% on Doppler ultrasound, the patient is to follow-up with Vascular Surgery for recommendations. He is to continue aspirin therapy as mentioned above.   For  history of hypertension, CVA, alcohol, tobacco abuse as well as depression, the patient is to continue his outpatient management.   In regards to hypertension, however, the patient's blood pressure medication combination of HCTZ and losartan were discontinued, and the patient is to continue losartan.  Orthostatic vital signs were performed, and those were negative. However, we were concerned about the patient possibly having an 11 element of hypotension in view of his left ICA stenosis with losartan dosing, especially if the patient takes this medication on an empty stomach; so, we felt that he would benefit from his blood pressure  management evaluation more frequently, and that will need to be discussed with his primary care physician and possibly have his blood pressure checked a few times, especially after he takes this medication in the morning as the patient's blood pressure may go down significantly in the early morning hours as he takes his medications which, in fact, would diffuse his pressure and blood pressures in his brain.  So,  the patient may benefit  from change in losartan dose to 50 mg twice daily dose instead of 100 mg at once to avoid significant hypertension episodes.  In the hospital, however, the patient had no hypertension.  The lowest blood pressure measured was at around 11:00 a.m.  At that time it was 071 systolic blood pressure; otherwise, his blood pressure fluctuated between 123 to 140s or 150s. The patient is being discharged in stable condition  with the above-mentioned medications and follow up.    THE PATIENT'S VITAL SIGNS ON THE DAY OF DISCHARGE: Temperature is 97.9, pulse 70s, respiratory rate was 18, blood pressure 123/74, saturation 100% on room air at rest.   TIME SPENT: 40 minutes on this patient.   ____________________________ Theodoro Grist, MD rv:cb D: 07/10/2013 18:01:37 ET T: 07/10/2013 23:11:50 ET JOB#: [id number]  cc: Theodoro Grist, MD, <Dictator> Algernon Huxley, MD Katha Cabal, MD [name]. Brynda Greathouse, MD Dwayne D. Clayborn Bigness, MD Theodoro Grist MD ELECTRONICALLY SIGNED 07/17/2013 9:40

## 2015-02-08 NOTE — Consult Note (Signed)
PATIENT NAME:  Donald Decker, Donald Decker MR#:  [id number] DATE OF BIRTH:  01-17-1946  DATE OF CONSULTATION:  07/10/2013  REFERRING PHYSICIAN:  Monica Becton, MD CONSULTING PHYSICIAN:  Senon Nixon D. Donald Bigness, MD  PRIMARY CARE PHYSICIAN: Nicky Pugh, MD  INDICATION: Possible syncope with weakness.   HISTORY OF PRESENT ILLNESS: Donald Decker is a 69 year old white male with history of hypertension, previous CVA, tobacco use and alcohol use who presented to the Emergency Room with weakness. He states he was outside doing yard work and got weak and went to the ground. He did not pass out, did not have syncope. He felt fine and did not want to come to the hospital, but his family brought him anyway. Once he was evaluated in the Emergency Room, he was subsequently admitted for further evaluation and management. He said he was weak all over. He crawled to the stair and called his wife. He was diaphoretic, shaky and unable to talk secondary to the generalized weakness. EMS picked him up. Because of general weakness, they brought him to the Emergency Room. His troponin was slightly elevated. CT of the head was negative, but because of the mild elevation of troponin he was admitted for further evaluation and care. He denied any chest pain or shortness of breath during the episode.  REVIEW OF SYSTEMS:  Denies blackout spells or syncope but had weakness and fatigue and fell. Denies nausea or vomiting. No fever. He had sweats but no chills. No weight loss. No weight gain. No hemoptysis or hematemesis. No bright red blood per rectum. No vision change or hearing change. Denies sputum production or cough.   PAST MEDICAL HISTORY: CVA, hypertension, depression, smoking, alcohol abuse   ALLERGIES: TAGAMET.  HOME MEDICATIONS: 1.  Zoloft 100 a day. 2.  Prednisone 60 tapering dose. 3.  Pravastatin 20 a day.  4.  Lovastatin 100 once a day. 5.  Inhalers twice a day. 6.  Aspirin 81 mg a day.   FAMILY HISTORY: CVA, coronary  artery disease, diabetes, hypertension.   SOCIAL HISTORY: Retired Marine scientist. Smoker, 2 to 3 beers a day drinker. Married, lives with his wife. No illicit drug use.   PHYSICAL EXAMINATION: VITAL SIGNS: Blood pressure was 140/80, pulse 75, respiratory rate 16, afebrile.  HEENT: Normocephalic, atraumatic. Pupils equal and reactive to light.  NECK: Supple. No significant JVD, bruits or adenopathy.  LUNGS: Essentially clear to auscultation and percussion. No significant wheeze, rhonchi or rale.  HEART: Regular rate and rhythm. No significant murmur, gallop or rub.  ABDOMEN: Benign.  EXTREMITIES: Within normal limits.  NEUROLOGIC: Intact.  SKIN: Normal.   LABORATORY AND DIAGNOSTICS: CBC was normal. CMP normal. Troponin 0.14. Glucose 98.   CT of the head was negative.   ASSESSMENT: 1.  Weakness. 2.  Near syncope.  3.  History of cerebrovascular accident.  4.  Hypertension.  5.  Elevated troponin   PLAN: 1.  Agree with admit. Rule out for myocardial infarction. Follow up EKG. Follow up cardiac enzymes. Agree with CT of the head. Would recommend limited cardiac evaluation including echocardiogram.  2.  Consider functional study. If the troponin stays flat or goes down, then would not recommend any further evaluation. He has no known cardiac history, but has significant risk factors including previous cerebrovascular accident and smoking with hyperlipidemia and hypertension.  4.  Echocardiogram will be helpful. Would consider whether he needs carotid Dopplers. Will hydrate the patient. Follow up further electrolytes. Advised the patient to quit alcohol consumption. Advised the patient to  quit smoking. Have the patient follow up as an outpatient.  ____________________________ Donald Decker. Donald Bigness, MD ddc:sb D: 07/10/2013 12:47:10 ET T: 07/10/2013 13:31:31 ET JOB#: [id number]  cc: Trameka Dorough D. Donald Bigness, MD, <Dictator> Yolonda Kida MD ELECTRONICALLY SIGNED 08/09/2013 10:34

## 2015-02-08 NOTE — H&P (Signed)
PATIENT NAME:  Donald Decker, Donald Decker MR#:  [id number] DATE OF BIRTH:  [birth year]  DATE OF ADMISSION:  07/09/2013  PRIMARY CARE PHYSICIAN: Dr. Nicky Pugh.    REFERRING PHYSICIAN: Dr. Delman Kitten.   CHIEF COMPLAINT: Generalized weakness, fell down to the floor.   HISTORY OF PRESENT ILLNESS: Donald Decker is a 69 year old male with history of hypertension, previous history of stroke, tobacco use and continued alcohol use. Presented to the Emergency Department after having an episode of severe generalized weakness. The patient has been experiencing generalized weakness of the last few days which is quite unusual for the patient. There were occasions where the patient could not get out of the car. Getting more fatigued with mild exertion. Today, the patient was working in the yard. All of a sudden felt severe generalized weakness and fell down to the floor. The patient could not stand up. The patient crawled to the staircase and was calling his wife. When wife came to the patient, the patient was extremely diaphoretic, shaking and unable to talk secondary to severe generalized weakness. Concerning this, EMS was called and was brought to the Emergency Department. Workup in the Emergency Department, the patient is found to have mild elevation of the troponin of 0.14. CK-MB has not been done. CT head without contrast: No acute intracranial abnormality. No obvious signs of infection are noted.   PAST MEDICAL HISTORY:  1. Hypertension.  2. Previous history of CVA.  3. Depression.  4. Continued tobacco use.  5. Continued alcohol use.   PAST SURGICAL HISTORY: Cholecystectomy.   ALLERGIES: TAGAMET.   HOME MEDICATIONS:  1. Zoloft 100 mg daily.  2. Prednisone 60 mg on tapering dose.  3. Pravastatin 20 mg day.  4. Losartan 100 mg once a day.  5. Budesonide/formoterol 2 puffs 2 times a day.  6. Aspirin 81 mg daily.   SOCIAL HISTORY: The patient continues to smoke 1 pack a day. Drinks alcohol 2 to 3 beers a  day. Married, lives with his wife. Denies using any illicit drugs. Retired Radiographer, therapeutic.   FAMILY HISTORY: Positive for CVA, coronary artery disease, diabetes mellitus and hypertension.   REVIEW OF SYSTEMS:   CONSTITUTIONAL: Severe generalized weakness. No loss of weight.  EYES: No change in vision.  ENT: The patient is hard of fading. No sore throat.  RESPIRATORY: Has some cough. No productive sputum.  CARDIOVASCULAR: No chest pain, palpitations. No pedal edema.  GASTROINTESTINAL: No nausea, vomiting, abdominal pain, lack of appetite.  GENITOURINARY: No dysuria or hematuria.  ENDOCRINE: No polyuria or polydipsia.  HEMATOLOGIC: No easy bruising or bleeding.  SKIN: No rash or lesions.  MUSCULOSKELETAL: Has some back pain.  NEUROLOGIC: No weakness or numbness in any part of the body.  PSYCHIATRIC: Has a history of depression.   PHYSICAL EXAMINATION:  GENERAL: This is a well-built, well-nourished, age-appropriate male lying down in the bed, not in distress.  VITAL SIGNS: Temperature 97.7, pulse 73, blood pressure 140/80, respiratory rate of 18, oxygen saturation is 97% on room air.  HEENT: Head normocephalic, atraumatic. There is no scleral icterus. Conjunctivae normal. Pupils equal and react to light. Mucous membranes moist. No pharyngeal erythema.  NECK: Supple. No lymphadenopathy. No JVD. No carotid bruit. No thyromegaly.  CHEST: Has no focal tenderness.  LUNGS: Bilaterally clear to auscultation.  HEART: S1 and S2 regular. No murmurs are heard. No pedal edema. Pulses 2+.  ABDOMEN: Bowel sounds present. Soft, nontender, nondistended. Could not appreciate any hepatosplenomegaly.  SKIN: No rash or lesions.  MUSCULOSKELETAL: Good range of motion in all of the extremities.  NEUROLOGIC: The patient is alert, oriented to place, person and time. Cranial nerves II through XII are intact. No motor and sensory deficits.   LABORATORIES: CBC is completely within normal limits. CMP is completely  within normal limits.   Troponin 0.14.   Glucose 98.   CT head without contrast: No acute intracranial process.   ASSESSMENT AND PLAN: Donald Decker is a 69 year old male brought to the Emergency Department with severe generalized weakness.  1. Generalized weakness: The cause is uncertain. No obvious signs of infection are found. However, will check the urinalysis and chest x-ray. The patient is afebrile. Does not have any elevated white blood cell count. Will check the TSH level and cortisol.   2. Mild elevation of the troponins: Will continue to check cardiac enzymes x3. The patient has multiple risk factors; age, hypertension, continued tobacco use and alcohol use, obesity. Will also obtain echocardiogram.  3. Syncope and fall: The patient did not have any loss of consciousness. Did not have any focal weakness. This seems to be unlikely a stroke. However, other differential diagnoses arrhythmias and acute coronary syndrome. The patient does not show any signs of dehydration. CT head without contrast: No acute intracranial abnormality. As mentioned above, will continue to cycle cardiac enzymes x3 and will obtain echocardiogram. Will obtain MRA of the brain to rule out any stroke.  4. Keep the patient on deep vein thrombosis prophylaxis with Lovenox.   TIME SPENT: 45 minutes.   ____________________________ Monica Becton, MD pv:gb D: 07/09/2013 21:53:08 ET T: 07/09/2013 22:08:09 ET JOB#: [id number]  cc: Monica Becton, MD, <Dictator> Monica Becton MD ELECTRONICALLY SIGNED 07/11/2013 21:06

## 2015-02-11 LAB — SURGICAL PATHOLOGY

## 2015-02-15 ENCOUNTER — Ambulatory Visit: Payer: Medicare HMO

## 2015-02-16 ENCOUNTER — Ambulatory Visit: Admission: RE | Admit: 2015-02-16 | Payer: Medicare HMO | Source: Ambulatory Visit

## 2015-02-17 NOTE — Consult Note (Signed)
Reason for Visit: This 69 year old Male patient presents to the clinic for initial evaluation of  bladder cancer .   Referred by Dr. Oliva Bustard.  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  Pathology Report pathology report has been requested for my review   Imaging Report CT scans have been requested for my review   Referral Report clinical notes reviewed   Planned Treatment Regimen possible combined modality treatment with curative intent   HPI   patient is a 69 year old male who initially presented with hematuria.workup in [place] revealed transitional cell carcinoma of the bladder with muscle invasion. 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 Patient received 3 cycles of neoadjuvant chemotherapy. He was offered cystectomy although has transferred her to his care to our cancer center and is seeking bladder preservation treatment. He is doing well at the present time. He specifically denies urinary frequency urgency incontinence diarrhea. He has been seen by medical oncology and is now referred to radiation oncology for opinion.  Past Hx:    CVA/Stroke:    Bladder Cancer:    Hypertension:    Depression:    Cholecystectomy:   Past, Family and Social History:  Past Medical History positive   Cardiovascular hypertension   Genitourinary bladder cancer as described above   Neurological/Psychiatric CVA; depression   Past Surgical History cholecystectomy   Family History positive   Family History Comments grandfather with prostate cancer family history of adult-onset diabetes and hypertension   Social History positive   Social History Comments greater than 40-pack-year smoking history no EtOH  abuse history   Additional Past Medical and Surgical History accompanied by his wife today   Allergies:   Tagamet: Hives  Latex: Unknown  Home Meds:  Home Medications: Medication Instructions Status  aspirin 81 mg oral delayed release tablet 1 tab(s) orally once a day Active  sertraline 100 mg oral tablet 1 tab(s) orally once a day Active  pantoprazole 40 mg oral delayed release tablet 1 tab(s) orally once a day Active  losartan 100 mg oral tablet 1 tab(s) orally once a day Active  Vitamin D3 1000 intl units oral tablet 1 tab(s) orally once a day Active  diazepam 5 mg oral tablet 1 tab(s) orally once a day (at bedtime), As Needed - for Inability to Sleep Active  multivitamin 1 tab(s) orally once a day Active  cyanocobalamin 1000 mcg oral tablet 1 tab(s) orally once a day Active  oxybutynin 5 mg oral tablet 1 tab(s) orally 3 times a day Active  Compazine 10 mg - one tablet by mouth every 6 hrs prn  Active   Review of Systems:  General negative   Performance Status (ECOG) 1   Skin negative   Breast negative   Ophthalmologic negative   ENMT negative   Respiratory and Thorax negative   Cardiovascular negative   Gastrointestinal negative   Genitourinary negative   Musculoskeletal negative   Neurological see HPI   Psychiatric negative   Hematology/Lymphatics negative   Endocrine negative   Allergic/Immunologic negative   Nursing Notes:  Nursing Vital Signs and Chemo Nursing Nursing Notes: *CC Vital Signs Flowsheet:   12-Jan-16 14:15  Temp Temperature 95.8  Pulse Pulse 89  Respirations Respirations 18  SBP SBP  141  DBP DBP 61  Pain Scale (0-10)  0  Current Weight (kg) (kg) 87.5   Physical Exam:  General/Skin/HEENT:  Skin normal   Eyes normal   ENMT normal   Head and Neck normal   Additional PE well-developed male in NAD with obvious sequela of CVA with left-sided weakness uses a cane for inflammatory assistance. Lungs are clear to A&P cardiac  examination shows regular rate and rhythm. Abdomen is benign. No suprapubic tenderness is noted. No peripheral edema in his lower extremities is noted.   Breasts/Resp/CV/GI/GU:  Respiratory and Thorax normal   Cardiovascular normal   Gastrointestinal normal   Genitourinary normal   MS/Neuro/Psych/Lymph:  Lymphatics normal   Relevent Results:   Relevant Scans and Labs CT scan of abdomen and pelvis has been requested for my review from [place] at [facility] and Plan: Impression:   probable stage III transitional cell carcinoma the bladder status post TURBT and neoadjuvant chemotherapyin 69 year old male with past medical history significant for CVA Plan:   at this time we need cystoscopy to monitor her response to prior treatment. If no residual disease is seen I think he would be a good candidate for bladder sparing treatment with chemotherapy and radiation therapy. Would plan on delivering 4500 cGy to a large field including most of his pelvic lymph nodes and bladder then boosting the area of primary tumor involvement another 2000 cGy. Risks and benefits of treatment including increased lower tract symptoms such as urinary frequency urgency nocturia, diarrhea, fatigue, alteration of blood counts, all were discussed in detail with the patient and his wife. Both seem to copying him a treatment plan well. I have referred the patient to Dr. Erlene Quan for repeat cystoscopy. I discussed the case personally with medical oncology. We'll see the patient in follow-up shortly after his cystoscopy.  I would like to take this opportunity for allowing me to participate in the care of your patient..  Fax to Physician:  Physicians To Recieve Fax: Sherlynn Stalls, MD - [phone] Marden Noble, MD - [phone] Doran Stabler, MD - [phone].  Electronic Signatures: Armstead Peaks (MD)  (Signed 12-Jan-16 14:52)  Authored: HPI, Diagnosis, Past Hx, PFSH, Allergies,  Home Meds, ROS, Nursing Notes, Physical Exam, Relevent Results, Encounter Assessment and Plan, Fax to Physician   Last Updated: 12-Jan-16 14:52 by Armstead Peaks (MD)

## 2015-02-17 NOTE — Op Note (Signed)
PATIENT NAME:  Donald Decker, Donald Decker MR#:  [id number] DATE OF BIRTH:  [birth year]  DATE OF PROCEDURE:  11/14/2014  PREOPERATIVE DIAGNOSES: History of T3 bladder cancer, bladder erythema.   POSTOPERATIVE DIAGNOSES: History of T3 bladder cancer, bladder erythema.   PROCEDURE PERFORMED: Cystoscopy, bladder biopsy, and fulguration.   ATTENDING SURGEON: Sherlynn Stalls, MD  ANESTHESIA: General anesthesia.   ESTIMATED BLOOD LOSS: Minimal.   DRAINS: None.   COMPLICATIONS: None.  SPECIMENS: Bladder biopsies.   INDICATION FOR PROCEDURE: This is a pleasant 69 year old male diagnosed with T3 bladder cancer who underwent an extensive TURBT in October 2015 for a clinical T3 bladder cancer. He has since undergone 3 rounds of chemotherapy with gemcitabine and cisplatin in December. He has transferred his care to [facility] for the remainder of his care and is considering a bladder sparing protocol with radiation. I was asked to evaluate his bladder in the office, which revealed a patch of erythema on the dome of the bladder with areas of necrosis. It was unclear whether this represents a scar from his previous resection site or residual tumor, . He was counseled to undergo a bladder biopsy in the operating room. Risks and benefits of the procedure explained in detail. The patient agreed to proceed as planned.   DESCRIPTION OF PROCEDURE: The patient was correctly identified in the preoperative holding area and informed consent was confirmed. He was brought to the operating suite and placed on the table in supine position. At this time, a universal timeout protocol was performed. All team members were identified. Venodyne boots were placed and he was administered IV Levaquin in the perioperative period. He was then placed under general anesthesia, repositioned on the bed in the dorsal lithotomy position and prepped and draped in standard surgical fashion. At this point in time, a 75 French  rigid cystoscope was advanced per urethra into the bladder, and the bladder was carefully inspected. The bladder, for the most part, was relatively uniform and smooth-walled; however, on the anterior surface of the bladder, occupying greater than 4-5 cm in a square space, the bladder was noted to be somewhat erythematous with areas of hanging tissue, which was somewhat necrotic. Cold cup biopsy forceps were then used to sample areas within the patch, down to the level of the muscle, which were all sent off together as a bladder biopsy, approximately 8-10 or so bladder biopsies were performed. A Bugbee was then brought in and the entire area of erythema was fulgurated over the entire surface. Hemostasis at this point was adequate. Again, the remainder of the bladder was inspected and there were no other lesions noted. The bladder was drained. The scope was removed. The patient was repositioned in the supine position after being reversed from anesthesia and taken to the PACU in stable condition. There were no complications in this case.     ____________________________ Sherlynn Stalls, MD ajb:mw D: 11/14/2014 12:45:16 ET T: 11/14/2014 17:33:15 ET JOB#: [id number]  cc: Sherlynn Stalls, MD, <Dictator> Sherlynn Stalls MD ELECTRONICALLY SIGNED 11/21/2014 14:30

## 2015-02-18 ENCOUNTER — Ambulatory Visit: Payer: Medicare HMO | Admitting: Radiation Oncology

## 2015-02-18 ENCOUNTER — Ambulatory Visit
Admission: RE | Admit: 2015-02-18 | Discharge: 2015-02-18 | Disposition: A | Discharge status: Home / Self Care | Payer: Medicare HMO | Source: Ambulatory Visit | Attending: Radiation Oncology | Admitting: Radiation Oncology

## 2015-02-18 DIAGNOSIS — C679 Malignant neoplasm of bladder, unspecified: Secondary | ICD-10-CM | POA: Diagnosis not present

## 2015-02-18 DIAGNOSIS — Z510 Encounter for antineoplastic radiation therapy: Secondary | ICD-10-CM | POA: Diagnosis present

## 2015-02-26 ENCOUNTER — Telehealth: Payer: Self-pay | Admitting: *Deleted

## 2015-02-26 NOTE — Telephone Encounter (Signed)
Would like to have referral to CARE Program

## 2015-02-26 NOTE — Telephone Encounter (Signed)
Referral faxed to rehab services for care program enrollment

## 2015-03-06 ENCOUNTER — Telehealth: Payer: Self-pay | Admitting: *Deleted

## 2015-03-06 NOTE — Telephone Encounter (Signed)
Received referral from Dr. Oliva Bustard for this patient to participate in CARE Program on February 07, 2015 and Feb 26, 2015.  RN attempted to contact patient on February 13, 2015 and Mar 06, 2015.  No answer and unable to leave message.

## 2015-03-27 ENCOUNTER — Other Ambulatory Visit: Payer: Self-pay

## 2015-03-27 DIAGNOSIS — C679 Malignant neoplasm of bladder, unspecified: Secondary | ICD-10-CM

## 2015-03-28 ENCOUNTER — Other Ambulatory Visit: Payer: Medicare HMO

## 2015-03-28 ENCOUNTER — Ambulatory Visit: Payer: Medicare HMO | Admitting: Radiation Oncology

## 2015-03-28 ENCOUNTER — Ambulatory Visit: Payer: Medicare HMO | Admitting: Oncology

## 2015-04-17 ENCOUNTER — Other Ambulatory Visit: Payer: Medicare HMO

## 2015-04-17 ENCOUNTER — Ambulatory Visit: Payer: Medicare HMO | Admitting: Oncology

## 2015-04-17 ENCOUNTER — Ambulatory Visit: Payer: Medicare HMO | Admitting: Radiation Oncology

## 2015-04-25 ENCOUNTER — Encounter: Payer: Self-pay | Admitting: Radiation Oncology

## 2015-04-25 ENCOUNTER — Ambulatory Visit
Admission: RE | Admit: 2015-04-25 | Discharge: 2015-04-25 | Disposition: A | Discharge status: Home / Self Care | Payer: Medicare HMO | Source: Ambulatory Visit | Attending: Radiation Oncology | Admitting: Radiation Oncology

## 2015-04-25 ENCOUNTER — Inpatient Hospital Stay (HOSPITAL_BASED_OUTPATIENT_CLINIC_OR_DEPARTMENT_OTHER): Payer: Medicare HMO | Admitting: Oncology

## 2015-04-25 ENCOUNTER — Inpatient Hospital Stay: Payer: Medicare HMO | Attending: Oncology

## 2015-04-25 VITALS — BP 132/81 | HR 92 | Temp 96.2°F | Wt 187.2 lb

## 2015-04-25 DIAGNOSIS — C670 Malignant neoplasm of trigone of bladder: Secondary | ICD-10-CM

## 2015-04-25 DIAGNOSIS — Z79899 Other long term (current) drug therapy: Secondary | ICD-10-CM | POA: Diagnosis not present

## 2015-04-25 DIAGNOSIS — C679 Malignant neoplasm of bladder, unspecified: Secondary | ICD-10-CM

## 2015-04-25 DIAGNOSIS — I2510 Atherosclerotic heart disease of native coronary artery without angina pectoris: Secondary | ICD-10-CM | POA: Diagnosis not present

## 2015-04-25 DIAGNOSIS — Z9221 Personal history of antineoplastic chemotherapy: Secondary | ICD-10-CM | POA: Insufficient documentation

## 2015-04-25 DIAGNOSIS — Z8673 Personal history of transient ischemic attack (TIA), and cerebral infarction without residual deficits: Secondary | ICD-10-CM | POA: Diagnosis not present

## 2015-04-25 DIAGNOSIS — Z7982 Long term (current) use of aspirin: Secondary | ICD-10-CM

## 2015-04-25 DIAGNOSIS — I10 Essential (primary) hypertension: Secondary | ICD-10-CM | POA: Insufficient documentation

## 2015-04-25 DIAGNOSIS — Z923 Personal history of irradiation: Secondary | ICD-10-CM

## 2015-04-25 DIAGNOSIS — F17210 Nicotine dependence, cigarettes, uncomplicated: Secondary | ICD-10-CM

## 2015-04-25 DIAGNOSIS — K219 Gastro-esophageal reflux disease without esophagitis: Secondary | ICD-10-CM

## 2015-04-25 LAB — COMPREHENSIVE METABOLIC PANEL
ALBUMIN: 4.5 g/dL (ref 3.5–5.0)
ALT: 15 U/L — ABNORMAL LOW (ref 17–63)
ANION GAP: 6 (ref 5–15)
AST: 19 U/L (ref 15–41)
Alkaline Phosphatase: 58 U/L (ref 38–126)
BILIRUBIN TOTAL: 0.7 mg/dL (ref 0.3–1.2)
BUN: 14 mg/dL (ref 6–20)
CHLORIDE: 98 mmol/L — AB (ref 101–111)
CO2: 24 mmol/L (ref 22–32)
Calcium: 8.8 mg/dL — ABNORMAL LOW (ref 8.9–10.3)
Creatinine, Ser: 1.17 mg/dL (ref 0.61–1.24)
GFR calc Af Amer: >60 mL/min (ref >60)
Glucose, Bld: 108 mg/dL — ABNORMAL HIGH (ref 65–99)
Potassium: 4.3 mmol/L (ref 3.5–5.1)
Sodium: 128 mmol/L — ABNORMAL LOW (ref 135–145)
Total Protein: 7.8 g/dL (ref 6.5–8.1)

## 2015-04-25 LAB — CBC
HCT: 41.3 % (ref 40.0–52.0)
Hemoglobin: 13.9 g/dL (ref 13.0–18.0)
MCH: 31.3 pg (ref 26.0–34.0)
MCHC: 33.8 g/dL (ref 32.0–36.0)
MCV: 92.8 fL (ref 80.0–100.0)
Platelets: 253 10*3/uL (ref 150–440)
RBC: 4.45 MIL/uL (ref 4.40–5.90)
RDW: 13.1 % (ref 11.5–14.5)
WBC: 7.4 10*3/uL (ref 3.8–10.6)

## 2015-04-25 NOTE — Progress Notes (Signed)
Radiation Oncology Follow up Note  Name: Donald Decker   Date:   04/25/2015 MRN:  [id number] DOB: January 28, 1946    This 69 y.o. male presents to the clinic today for Follow-up for stage III bladder cancer.  REFERRING PROVIDER: Marden Noble, MD  HPI: patient is a 69 year old male initially presented with hematuria found to have transitional cell carcinoma the bladder with muscle invasion. He is status post right parietal lobe CVA 2 years prior with residual deficit weakness in left lower extremity. Received new adjuvant chemotherapy and is now 1 month out having completed combined modality treatment with chemotherapy and radiation to his bladder. He is doing well. Specifically denies diarrhea dysuria or any other GI/GU complaints he has nocturia 1. No hematuria or other lower urinary tract symptoms..  COMPLICATIONS OF TREATMENT: none  FOLLOW UP COMPLIANCE: keeps appointments   PHYSICAL EXAM:  There were no vitals taken for this visit.patient does have obvious left-sided weakness walks and with a cane. Well-developed well-nourished patient in NAD. HEENT reveals PERLA, EOMI, discs not visualized.  Oral cavity is clear. No oral mucosal lesions are identified. Neck is clear without evidence of cervical or supraclavicular adenopathy. Lungs are clear to A&P. Cardiac examination is essentially unremarkable with regular rate and rhythm without murmur rub or thrill. Abdomen is benign with no organomegaly or masses noted. Motor sensory and DTR levels are equal and symmetric in the upper and lower extremities. Cranial nerves II through XII are grossly intact. Proprioception is intact. No peripheral adenopathy or edema is identified. No motor or sensory levels are noted. Crude visual fields are within normal range.   RADIOLOGY RESULTS: no current radiology results for review  PLAN: at the present time he is doing well recovering nicely from his combined modality treatment. Have discussed the case with  medical oncology will see urologist for possible repeat cystoscopy in the near future. We'll make other rectal lesions are most findings. Otherwise he is doing extremely well. I will see him back in 6 months for follow-up. He continues close follow-up care with medical oncology.  I would like to take this opportunity for allowing me to participate in the care of your patient.Armstead Peaks., MD

## 2015-04-25 NOTE — Progress Notes (Signed)
Patient does have living will.  Former smoker. 

## 2015-04-26 ENCOUNTER — Encounter: Payer: Self-pay | Admitting: Oncology

## 2015-04-26 NOTE — Progress Notes (Signed)
[place] @ [facility] Telephone:(336) [phone]  Fax:(336) [place]: [birth year]  MR#: [id number]  GNO#:[id number]  Patient Care Team: Marden Noble, MD as PCP - General (Internal Medicine)  CHIEF COMPLAINT:  Chief Complaint  Patient presents with  . 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  Status post 3 cycles of chemotherapy with cis-platinum and gemcitabine (diagnosis in September of 2015) chemotherapy was done at outside institution in [facility] 2.Patient has refused cystectomy.  Would start radiation and chemotherapy (January 04, 2015) 3.  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    Oncology Flowsheet 07/24/2014 08/17/2014 08/24/2014 09/06/2014 09/27/2014 10/04/2014  Day, Cycle - Day 1, Cycle 1 Day 8, Cycle 1 Day 1, Cycle 2 Day 1, Cycle 3 Day 8, Cycle 3  CISplatin (PLATINOL) IV - 100 mg - 48.5 mg/m2 50 mg/m2 -  dexamethasone (DECADRON) IJ - - - - - -  Dexamethasone Sodium Phosphate (DECADRON) IV - 12 mg - 12 mg 12 mg -  fosaprepitant (EMEND) IV - 150 mg - 150 mg 150 mg -  gemcitabine (GEMZAR) IV - 2,000 mg 1,000 mg/m2 1,000 mg/m2 1,000 mg/m2 1,000 mg/m2  ondansetron (ZOFRAN) IV - - - - - -  palonosetron (ALOXI) IV - 0.25 mg - 0.25 mg 0.25 mg -  prochlorperazine (COMPAZINE) PO - - 10 mg - - 10 mg    INTERVAL HISTORY: Patient is here for further follow-up and treatment consideration.  No hematuria.  No abdominal pain.  Patient is doing very well.  Has finished radiation chemotherapy appetite has been stable.  Here to discuss further planning of treatment. REVIEW OF SYSTEMS:    general status: Patient is feeling weak and tired.  No change in a  performance status.  No chills.  No fever. HEENT: Alopecia.  No evidence of stomatitis Lungs: No cough or shortness of breath Cardiac: No chest pain or paroxysmal nocturnal dyspnea GI: No nausea no vomiting no diarrhea no abdominal pain Skin: No rash Lower extremity no swelling Neurological system: No tingling.  No numbness.  No other focal signs Musculoskeletal system no bony pains  As per HPI. Otherwise, a complete review of systems is negatve.  PAST MEDICAL HISTORY: Past Medical History  Diagnosis Date  . Bladder neoplasm   . Hypertension   . History of TIA (transient ischemic attack)     2013--  NO RESIDUAL  . GERD (gastroesophageal reflux disease)   . Lower urinary tract symptoms (LUTS)   . Bladder cancer   . Aortic valve calcification     PAST SURGICAL HISTORY: Past Surgical History  Procedure Laterality Date  . Cholecystectomy open  AGE 44  . Negative sleep study  2013  . Transurethral resection of bladder tumor N/A 07/24/2014    Procedure: TRANSURETHRAL RESECTION OF BLADDER TUMOR (TURBT) GREATER THAN 5 CMS;  Surgeon: Festus Aloe, MD;  Location: [facility];  Service: Urology;  Laterality: N/A;    FAMILY HISTORY Family History  Problem Relation Age of Onset  . Heart Problems Mother   . Stroke Mother     ADVANCED DIRECTIVES:Patient does have advance healthcare directive, Patient  does not desire to make any changes  HEALTH MAINTENANCE: History  Substance Use Topics  . Smoking status: Current Every Day Smoker -- 0.50 packs/day for 50 years    Types: Cigarettes  . Smokeless tobacco: Never Used  . Alcohol Use: 4.2 oz/week    7 Cans of beer per week     Comment: DAILY BEER      Allergies  Allergen Reactions  . Latex Other (See Comments)    BLISTERING/ BRUISEING  . Tagamet [Cimetidine] Hives    Current Outpatient Prescriptions  Medication Sig Dispense Refill  . aspirin EC 81 MG tablet Take 81 mg by mouth daily.    . diazepam  (VALIUM) 5 MG tablet Take 5 mg by mouth daily.  5  . losartan-hydrochlorothiazide (HYZAAR) 100-25 MG per tablet Take 1 tablet by mouth every evening. 30 tablet 2  . Multiple Vitamins-Minerals (MULTIVITAMIN ADULTS 50+ PO) Take 1 tablet by mouth daily.    Marland Kitchen oxybutynin (DITROPAN) 5 MG tablet TAKE 1 TABLET BY MOUTH EVERY 8 HOURS AS NEEDED FOR BLADDER SPASMS 30 tablet 0  . pantoprazole (PROTONIX) 40 MG tablet Take 1 tablet (40 mg total) by mouth every evening. 30 tablet 2  . sertraline (ZOLOFT) 100 MG tablet Take 100 mg by mouth every evening.    . prochlorperazine (COMPAZINE) 10 MG tablet Take 1 tablet (10 mg total) by mouth every 6 (six) hours as needed for nausea or vomiting. (Patient not taking: Reported on 04/25/2015) 30 tablet 0   No current facility-administered medications for this visit.    OBJECTIVE:  Filed Vitals:   04/25/15 1458  BP: 132/81  Pulse: 92  Temp: 96.2 F (35.7 C)     Body mass index is 27.66 kg/(m^2).    ECOG FS:1 - Symptomatic but completely ambulatory  PHYSICAL EXAM: General  status: Performance status is good.  Patient has not lost significant weight HEENT: No evidence of stomatitis. Sclera and conjunctivae :: No jaundice.   pale looking. Lungs: Air  entry equal on both sides.  No rhonchi.  No rales.  Cardiac: Heart sounds are normal.  No pericardial rub.  No murmur. Lymphatic system: Cervical, axillary, inguinal, lymph nodes not palpable GI: Abdomen is soft.  No ascites.  Liver spleen not palpable.  No tenderness.  Bowel sounds are within normal limit Lower extremity: No edema Neurological system: Higher functions, cranial nerves intact no evidence of peripheral neuropathy. Skin: No rash.  No ecchymosis.Marland Kitchen   LAB RESULTS:  Appointment on 04/25/2015  Component Date Value Ref Range Status  . WBC 04/25/2015 7.4  3.8 - 10.6 K/uL Final  . RBC 04/25/2015 4.45  4.40 - 5.90 MIL/uL Final  . Hemoglobin 04/25/2015 13.9  13.0 - 18.0 g/dL Final  . HCT 04/25/2015 41.3   40.0 - 52.0 % Final  . MCV 04/25/2015 92.8  80.0 - 100.0 fL Final  . MCH 04/25/2015 31.3  26.0 - 34.0 pg Final  . MCHC 04/25/2015 33.8  32.0 - 36.0 g/dL Final  . RDW 04/25/2015 13.1  11.5 - 14.5 % Final  . Platelets 04/25/2015 253  150 - 440 K/uL Final  . Sodium 04/25/2015 128* 135 - 145 mmol/L Final  . Potassium 04/25/2015 4.3  3.5 - 5.1 mmol/L Final  . Chloride 04/25/2015 98* 101 - 111 mmol/L Final  . CO2 04/25/2015 24  22 - 32 mmol/L Final  . Glucose, Bld 04/25/2015 108* 65 - 99 mg/dL Final  . BUN 04/25/2015 14  6 - 20 mg/dL Final  .  Creatinine, Ser 04/25/2015 1.17  0.61 - 1.24 mg/dL Final  . Calcium 04/25/2015 8.8* 8.9 - 10.3 mg/dL Final  . Total Protein 04/25/2015 7.8  6.5 - 8.1 g/dL Final  . Albumin 04/25/2015 4.5  3.5 - 5.0 g/dL Final  . AST 04/25/2015 19  15 - 41 U/L Final  . ALT 04/25/2015 15* 17 - 63 U/L Final  . Alkaline Phosphatase 04/25/2015 58  38 - 126 U/L Final  . Total Bilirubin 04/25/2015 0.7  0.3 - 1.2 mg/dL Final  . GFR calc non Af Amer 04/25/2015 >60  >60 mL/min Final  . GFR calc Af Amer 04/25/2015 >60  >60 mL/min Final   Comment: (NOTE) The eGFR has been calculated using the CKD EPI equation. This calculation has not been validated in all clinical situations. eGFR's persistently <60 mL/min signify possible Chronic Kidney Disease.   . Anion gap 04/25/2015 6  5 - 15 Final       ASSESSMENT: Carcinoma of bladder T3 N0 M0 tumor status post chemoradiation therapy patient has refused cystectomy (patient also had neoadjuvant chemotherapy is outside institution) Patient had a small cell component with adenocarcinoma Overall improving performance status  MEDICAL DECISION MAKING:  All lab data has been reviewed. After prolonged discussion patient was agreeable to go cystoscopy at some point in time for reassessment of the disease however is making things very clear that he does not want any surgical intervention nor any further therapy.  At present  time Reevaluation with urologist has been planned in September for cystoscopy Total duration of visit was 35  minutes.  50% or more time was spent in counseling patient and family regarding prognosis and options of treatment and available resources Patient expressed understanding and was in agreement with this plan. He also understands that He can call clinic at any time with any questions, concerns, or complaints.    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

## 2015-04-30 ENCOUNTER — Telehealth: Payer: Self-pay | Admitting: *Deleted

## 2015-04-30 NOTE — Telephone Encounter (Signed)
Wife notified and will call back if she has not heard from them by [place]

## 2015-04-30 NOTE — Telephone Encounter (Signed)
Will resend referral today.

## 2015-05-16 ENCOUNTER — Telehealth: Payer: Self-pay | Admitting: Medical Oncology

## 2015-05-16 NOTE — Telephone Encounter (Signed)
Called Donald Decker and left a message  to see how he is doing. He was being treated by Dr. Alen Blew and now being cared for at [facility]. Nurses here at [facility] just wanted to let him know we are thinking about him and hope he is doing well.

## 2015-05-17 ENCOUNTER — Other Ambulatory Visit: Payer: Self-pay | Admitting: Urology

## 2015-05-17 ENCOUNTER — Ambulatory Visit (INDEPENDENT_AMBULATORY_CARE_PROVIDER_SITE_OTHER): Payer: Medicare HMO | Admitting: Urology

## 2015-05-17 VITALS — BP 141/85 | HR 87 | Wt 186.9 lb

## 2015-05-17 DIAGNOSIS — C670 Malignant neoplasm of trigone of bladder: Secondary | ICD-10-CM | POA: Diagnosis not present

## 2015-05-17 MED ORDER — LIDOCAINE HCL 2 % EX GEL
1.0000 "application " | Freq: Once | CUTANEOUS | Status: AC
  Administered 2015-05-17: 1 via URETHRAL

## 2015-05-17 MED ORDER — CIPROFLOXACIN HCL 500 MG PO TABS
500.0000 mg | ORAL_TABLET | Freq: Once | ORAL | Status: AC
  Administered 2015-05-17: 500 mg via ORAL

## 2015-05-17 NOTE — Progress Notes (Signed)
05/17/2015 8:53 AM   Donald Decker Aug 21, 1946 [id number]  Referring provider: Marden Noble, MD [address] [place], [place] [address]  Chief Complaint  Patient presents with  . Cysto    HPI:   69 year old male with history of clinical T3N0M0 transitional cell carcinoma diagnosed in 07/2014. He initially presented as a workup for gross hematuria and was found to have a large bladder mass involving the dome and anterior surface of the bladder. He is status post TURBT by Dr. 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 Additionally, the tumor at the posterior dome did show greater than 50% small cell component.  He elected to undergo bladder sparing protocol and received 3 cycles of cis-platinum/gemcitabine chemotherapy which was completed in December 2015.   He then transferred his care to [facility] after chemotherapy for cystoscopy to evaluate if there is any evidence of residual tumor following resection. There was a suspicious erythematous patch on the dome of the bladder at the site of previous transurethral resection. He was taken back to the operating room on 11/14/2014 for bladder biopsies and fulguration of this area.  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 There was no evidence of a small cell component of his cancer.   Patient refused radical cystectomy and ultimately elected to have radiation in March 2016 with Dr. Berton Mount followed by cis-platinum 5 completed May 2016 with Dr.Choksi.     He returns today for cystoscopy to reevaluate tumor burden in his bladder. He remains adamant that he does not want a cystectomy at any point in the future.  He is overall been doing  quite well. He denies any urinary symptoms today. No weight loss or pain.   PMH: Past Medical History  Diagnosis Date  . Bladder neoplasm   . Hypertension   . History of TIA (transient ischemic attack)     2013--  NO RESIDUAL  . GERD (gastroesophageal reflux disease)   . Lower urinary tract symptoms (LUTS)   . Bladder cancer   . Aortic valve calcification     Surgical History: Past Surgical History  Procedure Laterality Date  . Cholecystectomy open  AGE 34  . Negative sleep study  2013  . Transurethral resection of bladder tumor N/A 07/24/2014    Procedure: TRANSURETHRAL RESECTION OF BLADDER TUMOR (TURBT) GREATER THAN 5 CMS;  Surgeon: Festus Aloe, MD;  Location: [facility];  Service: Urology;  Laterality: N/A;    Home Medications:    Medication List       This list is accurate as of: 05/17/15 11:59 PM.  Always use your most recent med list.               aspirin EC 81 MG tablet  Take 81 mg by mouth daily.     diazepam 5 MG tablet  Commonly known as:  VALIUM  Take 5 mg by mouth daily.     losartan-hydrochlorothiazide 100-25 MG per tablet  Commonly known as:  HYZAAR  Take 1 tablet by mouth every evening.     MULTIVITAMIN ADULTS 50+ PO  Take 1 tablet by mouth daily.     oxybutynin 5 MG tablet  Commonly known as:  DITROPAN  TAKE 1 TABLET BY MOUTH EVERY 8 HOURS AS NEEDED FOR BLADDER SPASMS  pantoprazole 40 MG tablet  Commonly known as:  PROTONIX  Take 1 tablet (40 mg total) by mouth every evening.     prochlorperazine 10 MG tablet  Commonly known as:  COMPAZINE  Take 1 tablet (10 mg total) by mouth every 6 (six) hours as needed for nausea or vomiting.     sertraline 100 MG tablet  Commonly known as:  ZOLOFT  Take 100 mg by mouth every evening.        Allergies:  Allergies  Allergen Reactions  . Latex Other (See Comments)    BLISTERING/ BRUISEING  . Tagamet [Cimetidine] Hives    Family History: Family History  Problem  Relation Age of Onset  . Heart Problems Mother   . Stroke Mother     Social History:  reports that he has been smoking Cigarettes.  He has a 25 pack-year smoking history. He has never used smokeless tobacco. He reports that he drinks about 4.2 oz of alcohol per week. He reports that he does not use illicit drugs.   Physical Exam: BP 141/85 mmHg  Pulse 87  Wt 186 lb 14.4 oz (84.777 kg)  Constitutional:  Alert and oriented, No acute distress. HEENT: [place] AT, moist mucus membranes.  Trachea midline, no masses. Cardiovascular: No clubbing, cyanosis, or edema. Respiratory: Normal respiratory effort, no increased work of breathing. GI: Abdomen is soft, nontender, nondistended, no abdominal masses GU: No CVA tenderness. Normal phallus with orthotopic meatus. Skin: No rashes, bruises or suspicious lesions. Neurologic: Grossly intact, no focal deficits, moving all 4 extremities. Psychiatric: Normal mood and affect.  Laboratory Data: Lab Results  Component Value Date   WBC 7.4 04/25/2015   HGB 13.9 04/25/2015   HCT 41.3 04/25/2015   MCV 92.8 04/25/2015   PLT 253 04/25/2015    Lab Results  Component Value Date   CREATININE 1.17 04/25/2015   Urinalysis Results for orders placed or performed in visit on 05/17/15  Microscopic Examination  Result Value Ref Range   WBC, UA 0-5 0 -  5 /hpf   RBC, UA 3-10 (A) 0 -  2 /hpf   Epithelial Cells (non renal) >10 (A) 0 - 10 /hpf   Renal Epithel, UA None seen None seen /hpf   Bacteria, UA None seen None seen/Few  Urinalysis, Complete  Result Value Ref Range   Specific Gravity, UA 1.015 1.005 - 1.030   pH, UA 7.0 5.0 - 7.5   Color, UA Yellow Yellow   Appearance Ur Clear Clear   Leukocytes, UA Negative Negative   Protein, UA Negative Negative/Trace   Glucose, UA Negative Negative   Ketones, UA Negative Negative   RBC, UA Trace (A) Negative   Bilirubin, UA Negative Negative   Urobilinogen, Ur 0.2 0.2 - 1.0 mg/dL   Nitrite, UA Negative  Negative   Microscopic Examination See below:     Pertinent Imaging: No new recent imaging   Cystoscopy Procedure Note  Patient identification was confirmed, informed consent was obtained, and patient was prepped using Betadine solution.  Lidocaine jelly was administered per urethral meatus.    Preoperative abx where received prior to procedure.     Pre-Procedure: - Inspection reveals a normal caliber ureteral meatus.  Procedure: The flexible cystoscope was introduced without difficulty - No urethral strictures/lesions are present. - Normal prostate  - Normal bladder neck - Bilateral ureteral orifices identified - Bladder mucosa  reveals no ulcers, tumors, or lesions.  Very subtle patch of hypervascularity near dome of bladder at the site  of previous resection but not otherwise suspicious. There is also a hypopigmented scar just adjacent to that area presumably the site of previous resection. - No bladder stones - No trabeculation  Retroflexion shows no significant median lobe.   Post-Procedure: - Patient tolerated the procedure well   Assessment & Plan: 69 year old male with T3N0M0 transitional cell carcinoma diagnosed in 07/2014 status post bladder sparing protocol including chemotherapy with Jim/cysts and radiation.  His bladder looks excellent today without any obvious signs of malignancy.  1. 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 We'll defer imaging surveillance to medical oncology. He was advised to return sooner if he develops any significant urinary symptoms, weight loss, or anything else concerning to him. - Urinalysis, Complete - ciprofloxacin (CIPRO) tablet 500 mg; Take 1 tablet (500 mg total) by mouth once. - lidocaine (XYLOCAINE) 2 % jelly 1 application; Place 1 application into the urethra once.    Return in about 6 months (around 11/17/2015) for cysto.  Hollice Espy, MD  [facility] [address], [place], [place] [address] [phone]

## 2015-05-18 LAB — URINALYSIS, COMPLETE
Bilirubin, UA: NEGATIVE
Glucose, UA: NEGATIVE
Ketones, UA: NEGATIVE
LEUKOCYTES UA: NEGATIVE
Nitrite, UA: NEGATIVE
PH UA: 7 (ref 5.0–7.5)
PROTEIN UA: NEGATIVE
Specific Gravity, UA: 1.015 (ref 1.005–1.030)
Urobilinogen, Ur: 0.2 mg/dL (ref 0.2–1.0)

## 2015-05-18 LAB — MICROSCOPIC EXAMINATION
Bacteria, UA: NONE SEEN
Epithelial Cells (non renal): >10 /hpf — AB (ref 0–10)
Renal Epithel, UA: NONE SEEN /hpf

## 2015-06-25 ENCOUNTER — Other Ambulatory Visit: Payer: Self-pay | Admitting: *Deleted

## 2015-06-25 MED ORDER — LOSARTAN POTASSIUM-HCTZ 100-25 MG PO TABS: 1.0000 | ORAL_TABLET | Freq: Every evening | ORAL | Status: DC

## 2015-08-04 NOTE — Telephone Encounter (Signed)
Error

## 2015-08-28 ENCOUNTER — Inpatient Hospital Stay: Payer: Medicare HMO

## 2015-08-28 ENCOUNTER — Inpatient Hospital Stay: Payer: Medicare HMO | Attending: Oncology | Admitting: Oncology

## 2015-08-28 VITALS — BP 144/69 | HR 74 | Temp 96.0°F | Wt 182.4 lb

## 2015-08-28 DIAGNOSIS — C670 Malignant neoplasm of trigone of bladder: Secondary | ICD-10-CM

## 2015-08-28 DIAGNOSIS — I10 Essential (primary) hypertension: Secondary | ICD-10-CM | POA: Diagnosis not present

## 2015-08-28 DIAGNOSIS — Z8551 Personal history of malignant neoplasm of bladder: Secondary | ICD-10-CM | POA: Diagnosis not present

## 2015-08-28 DIAGNOSIS — Z79899 Other long term (current) drug therapy: Secondary | ICD-10-CM | POA: Diagnosis not present

## 2015-08-28 DIAGNOSIS — K219 Gastro-esophageal reflux disease without esophagitis: Secondary | ICD-10-CM | POA: Insufficient documentation

## 2015-08-28 DIAGNOSIS — Z923 Personal history of irradiation: Secondary | ICD-10-CM | POA: Diagnosis not present

## 2015-08-28 DIAGNOSIS — Z9221 Personal history of antineoplastic chemotherapy: Secondary | ICD-10-CM | POA: Insufficient documentation

## 2015-08-28 DIAGNOSIS — F17210 Nicotine dependence, cigarettes, uncomplicated: Secondary | ICD-10-CM | POA: Diagnosis not present

## 2015-08-28 DIAGNOSIS — Z8673 Personal history of transient ischemic attack (TIA), and cerebral infarction without residual deficits: Secondary | ICD-10-CM | POA: Diagnosis not present

## 2015-08-28 DIAGNOSIS — Z7982 Long term (current) use of aspirin: Secondary | ICD-10-CM | POA: Diagnosis not present

## 2015-08-28 DIAGNOSIS — I2510 Atherosclerotic heart disease of native coronary artery without angina pectoris: Secondary | ICD-10-CM | POA: Insufficient documentation

## 2015-08-28 LAB — COMPREHENSIVE METABOLIC PANEL
ALT: 13 U/L — ABNORMAL LOW (ref 17–63)
AST: 19 U/L (ref 15–41)
Albumin: 4.3 g/dL (ref 3.5–5.0)
Alkaline Phosphatase: 50 U/L (ref 38–126)
Anion gap: 7 (ref 5–15)
BILIRUBIN TOTAL: 0.7 mg/dL (ref 0.3–1.2)
BUN: 14 mg/dL (ref 6–20)
CALCIUM: 9.5 mg/dL (ref 8.9–10.3)
CO2: 27 mmol/L (ref 22–32)
Chloride: 93 mmol/L — ABNORMAL LOW (ref 101–111)
Creatinine, Ser: 1.1 mg/dL (ref 0.61–1.24)
GFR calc Af Amer: >60 mL/min (ref >60)
Glucose, Bld: 99 mg/dL (ref 65–99)
Potassium: 4.9 mmol/L (ref 3.5–5.1)
Sodium: 127 mmol/L — ABNORMAL LOW (ref 135–145)
TOTAL PROTEIN: 7.4 g/dL (ref 6.5–8.1)

## 2015-08-28 LAB — CBC WITH DIFFERENTIAL/PLATELET
Basophils Absolute: 0 10*3/uL (ref 0–0.1)
Basophils Relative: 0 %
Eosinophils Absolute: 0.1 10*3/uL (ref 0–0.7)
Eosinophils Relative: 1 %
HEMATOCRIT: 40.5 % (ref 40.0–52.0)
Hemoglobin: 13.8 g/dL (ref 13.0–18.0)
LYMPHS PCT: 18 %
Lymphs Abs: 1 10*3/uL (ref 1.0–3.6)
MCH: 30.1 pg (ref 26.0–34.0)
MCHC: 34.1 g/dL (ref 32.0–36.0)
MCV: 88.5 fL (ref 80.0–100.0)
MONO ABS: 0.5 10*3/uL (ref 0.2–1.0)
Monocytes Relative: 9 %
NEUTROS ABS: 4.1 10*3/uL (ref 1.4–6.5)
Neutrophils Relative %: 72 %
Platelets: 246 10*3/uL (ref 150–440)
RBC: 4.57 MIL/uL (ref 4.40–5.90)
RDW: 13.5 % (ref 11.5–14.5)
WBC: 5.8 10*3/uL (ref 3.8–10.6)

## 2015-08-28 MED ORDER — LOSARTAN POTASSIUM-HCTZ 100-25 MG PO TABS: 1.0000 | ORAL_TABLET | Freq: Every evening | ORAL | Status: AC

## 2015-08-28 MED ORDER — PANTOPRAZOLE SODIUM 40 MG PO TBEC: 40.0000 mg | DELAYED_RELEASE_TABLET | Freq: Every evening | ORAL | Status: AC

## 2015-08-28 MED ORDER — SERTRALINE HCL 100 MG PO TABS: 100.0000 mg | ORAL_TABLET | Freq: Every evening | ORAL | Status: AC

## 2015-08-28 NOTE — Progress Notes (Signed)
Patient requesting refill for Zoloft and Hyzaar.

## 2015-09-01 ENCOUNTER — Encounter: Payer: Self-pay | Admitting: Oncology

## 2015-09-01 NOTE — Progress Notes (Signed)
[place] @ [facility] Telephone:(336) [phone]  Fax:(336) [place]: 1946/02/08  MR#: [id number]  PNT#:[id number]  Patient Care Team: Marden Noble, MD as PCP - General (Internal Medicine)  CHIEF COMPLAINT:  Chief Complaint  Patient presents with  . 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  Status post 3 cycles of chemotherapy with cis-platinum and gemcitabine (diagnosis in September of 2015) chemotherapy was done at outside institution in [facility] 2.Patient has refused cystectomy.  Would start radiation and chemotherapy (January 04, 2015) 3.  Patient has finished weekly cis-platinum 5 and radiation therapy in May of 2016       Oncology Flowsheet 07/24/2014 08/17/2014 08/24/2014 09/06/2014 09/27/2014 10/04/2014  Day, Cycle - Day 1, Cycle 1 Day 8, Cycle 1 Day 1, Cycle 2 Day 1, Cycle 3 Day 8, Cycle 3  CISplatin (PLATINOL) IV - 100 mg - 48.5 mg/m2 50 mg/m2 -  dexamethasone (DECADRON) IJ - - - - - -  Dexamethasone Sodium Phosphate (DECADRON) IV - 12 mg - 12 mg 12 mg -  fosaprepitant (EMEND) IV - 150 mg - 150 mg 150 mg -  gemcitabine (GEMZAR) IV - 2,000 mg 1,000 mg/m2 1,000 mg/m2 1,000 mg/m2 1,000 mg/m2  ondansetron (ZOFRAN) IV - - - - - -  palonosetron (ALOXI) IV - 0.25 mg - 0.25 mg 0.25 mg -  prochlorperazine (COMPAZINE) PO - - 10 mg - - 10 mg    INTERVAL HISTORY: Patient is here for further follow-up and treatment consideration.  No hematuria.  No abdominal pain.  Patient is doing very well.  Has finished radiation chemotherapy appetite has been stable.  Here to discuss further planning of treatment. Patient is here for ongoing evaluation and treatment consideration No hematuria Appetite has been stable Functional status remains stable REVIEW OF SYSTEMS:    general status: .  No change in a performance status.  No  chills.  No fever. HEENT: Alopecia.  No evidence of stomatitis Lungs: No cough or shortness of breath Cardiac: No chest pain or paroxysmal nocturnal dyspnea GI: No nausea no vomiting no diarrhea no abdominal pain Skin: No rash Lower extremity no swelling Neurological system: No tingling.  No numbness.  No other focal signs Musculoskeletal system no bony pains  As per HPI. Otherwise, a complete review of systems is negatve.  PAST MEDICAL HISTORY: Past Medical History  Diagnosis Date  . Bladder neoplasm   . Hypertension   . History of TIA (transient ischemic attack)     2013--  NO RESIDUAL  . GERD (gastroesophageal reflux disease)   . Lower urinary tract symptoms (LUTS)   . Bladder cancer ([place])   . Aortic valve calcification     PAST SURGICAL HISTORY: Past Surgical History  Procedure Laterality Date  . Cholecystectomy open  AGE 42  . Negative sleep study  2013  . Transurethral resection of bladder tumor N/A 07/24/2014    Procedure: TRANSURETHRAL RESECTION OF BLADDER TUMOR (TURBT) GREATER THAN 5 CMS;  Surgeon: Festus Aloe, MD;  Location: [facility];  Service: Urology;  Laterality: N/A;    FAMILY HISTORY Family History  Problem Relation Age of Onset  . Heart Problems Mother   . Stroke Mother     ADVANCED DIRECTIVES:Patient does have advance healthcare directive, Patient   does not desire to make any changes  HEALTH MAINTENANCE: Social History  Substance Use Topics  . Smoking status: Current Every Day Smoker -- 0.50 packs/day for 50 years    Types: Cigarettes  . Smokeless tobacco: Never Used  . Alcohol Use: 4.2 oz/week    7 Cans of beer per week     Comment: DAILY BEER      Allergies  Allergen Reactions  . Latex Other (See Comments)    BLISTERING/ BRUISEING  . Tagamet [Cimetidine] Hives    Current Outpatient Prescriptions  Medication Sig Dispense Refill  . aspirin EC 81 MG tablet Take 81 mg by mouth daily.    . diazepam (VALIUM) 5 MG  tablet Take 5 mg by mouth daily.  5  . losartan-hydrochlorothiazide (HYZAAR) 100-25 MG tablet Take 1 tablet by mouth every evening. 30 tablet 6  . Multiple Vitamins-Minerals (MULTIVITAMIN ADULTS 50+ PO) Take 1 tablet by mouth daily.    . pantoprazole (PROTONIX) 40 MG tablet Take 1 tablet (40 mg total) by mouth every evening. 30 tablet 6  . prochlorperazine (COMPAZINE) 10 MG tablet Take 1 tablet (10 mg total) by mouth every 6 (six) hours as needed for nausea or vomiting. 30 tablet 0  . sertraline (ZOLOFT) 100 MG tablet Take 1 tablet (100 mg total) by mouth every evening. 30 tablet 6   No current facility-administered medications for this visit.    OBJECTIVE:  Filed Vitals:   08/28/15 1452  BP: 144/69  Pulse: 74  Temp: 96 F (35.6 C)     Body mass index is 26.95 kg/(m^2).    ECOG FS:1 - Symptomatic but completely ambulatory  PHYSICAL EXAM: General  status: Performance status is good.  Patient has not lost significant weight HEENT: No evidence of stomatitis. Sclera and conjunctivae :: No jaundice.   pale looking. Lungs: Air  entry equal on both sides.  No rhonchi.  No rales.  Cardiac: Heart sounds are normal.  No pericardial rub.  No murmur. Lymphatic system: Cervical, axillary, inguinal, lymph nodes not palpable GI: Abdomen is soft.  No ascites.  Liver spleen not palpable.  No tenderness.  Bowel sounds are within normal limit Lower extremity: No edema Neurological system: Higher functions, cranial nerves intact no evidence of peripheral neuropathy. Skin: No rash.  No ecchymosis.Marland Kitchen   LAB RESULTS:  Appointment on 08/28/2015  Component Date Value Ref Range Status  . WBC 08/28/2015 5.8  3.8 - 10.6 K/uL Final  . RBC 08/28/2015 4.57  4.40 - 5.90 MIL/uL Final  . Hemoglobin 08/28/2015 13.8  13.0 - 18.0 g/dL Final  . HCT 08/28/2015 40.5  40.0 - 52.0 % Final  . MCV 08/28/2015 88.5  80.0 - 100.0 fL Final  . MCH 08/28/2015 30.1  26.0 - 34.0 pg Final  . MCHC 08/28/2015 34.1  32.0 - 36.0 g/dL  Final  . RDW 08/28/2015 13.5  11.5 - 14.5 % Final  . Platelets 08/28/2015 246  150 - 440 K/uL Final  . Neutrophils Relative % 08/28/2015 72   Final  . Neutro Abs 08/28/2015 4.1  1.4 - 6.5 K/uL Final  . Lymphocytes Relative 08/28/2015 18   Final  . Lymphs Abs 08/28/2015 1.0  1.0 - 3.6 K/uL Final  . Monocytes Relative 08/28/2015 9   Final  . Monocytes Absolute 08/28/2015 0.5  0.2 - 1.0 K/uL Final  . Eosinophils Relative 08/28/2015 1   Final  . Eosinophils Absolute 08/28/2015 0.1  0 - 0.7 K/uL Final  . Basophils Relative 08/28/2015 0   Final  . Basophils Absolute  08/28/2015 0.0  0 - 0.1 K/uL Final  . Sodium 08/28/2015 127* 135 - 145 mmol/L Final  . Potassium 08/28/2015 4.9  3.5 - 5.1 mmol/L Final  . Chloride 08/28/2015 93* 101 - 111 mmol/L Final  . CO2 08/28/2015 27  22 - 32 mmol/L Final  . Glucose, Bld 08/28/2015 99  65 - 99 mg/dL Final  . BUN 08/28/2015 14  6 - 20 mg/dL Final  . Creatinine, Ser 08/28/2015 1.10  0.61 - 1.24 mg/dL Final  . Calcium 08/28/2015 9.5  8.9 - 10.3 mg/dL Final  . Total Protein 08/28/2015 7.4  6.5 - 8.1 g/dL Final  . Albumin 08/28/2015 4.3  3.5 - 5.0 g/dL Final  . AST 08/28/2015 19  15 - 41 U/L Final  . ALT 08/28/2015 13* 17 - 63 U/L Final  . Alkaline Phosphatase 08/28/2015 50  38 - 126 U/L Final  . Total Bilirubin 08/28/2015 0.7  0.3 - 1.2 mg/dL Final  . GFR calc non Af Amer 08/28/2015 >60  >60 mL/min Final  . GFR calc Af Amer 08/28/2015 >60  >60 mL/min Final   Comment: (NOTE) The eGFR has been calculated using the CKD EPI equation. This calculation has not been validated in all clinical situations. eGFR's persistently <60 mL/min signify possible Chronic Kidney Disease.   . Anion gap 08/28/2015 7  5 - 15 Final       ASSESSMENT: Carcinoma of bladder T3 N0 M0 tumor status post chemoradiation therapy patient has refused cystectomy (patient also had neoadjuvant chemotherapy is outside institution) Patient had a small cell component with  adenocarcinoma Overall improving performance status Patient needs another evaluation with cystoscopy which is being planned Depending on the results and CT scan or a PET scan can be arranged for further follow-up  MEDICAL DECISION MAKING:  All lab data has been reviewed. Patient is very clear that does not want any surgical intervention at any time during  his illness.    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

## 2015-09-04 ENCOUNTER — Telehealth: Payer: Self-pay | Admitting: Urology

## 2015-09-04 NOTE — Telephone Encounter (Signed)
Pt's wife, Coralyn Mark called and wanted to know if it was necessary for him to come in this Friday for a cysto.  He had one on 7/29.  He was referred by Dr. Darnelle Bos.  Please give her a call.

## 2015-09-05 NOTE — Telephone Encounter (Signed)
LMOM

## 2015-09-06 ENCOUNTER — Other Ambulatory Visit: Payer: Medicare HMO | Admitting: Urology

## 2015-09-06 ENCOUNTER — Encounter: Payer: Self-pay | Admitting: Urology

## 2015-09-09 NOTE — Telephone Encounter (Signed)
Not able to get in touch with pt and pt did not show for appt.

## 2015-09-17 ENCOUNTER — Telehealth: Payer: Self-pay | Admitting: *Deleted

## 2015-09-17 NOTE — Telephone Encounter (Signed)
Called to inquire when he is to see Dr Erlene Quan, she thought it was to be in 3 months and it got set up for the following week, so she cancelled it.Was informed after discussing with [facility] Dr Metro Kung nurse she was informed to reschedule it for 3 months if that was her choice so long as it is done before he returns to see D rChoksi

## 2015-10-03 ENCOUNTER — Other Ambulatory Visit: Payer: Medicare HMO | Admitting: Urology

## 2015-10-04 ENCOUNTER — Other Ambulatory Visit: Payer: Medicare HMO | Admitting: Urology

## 2015-10-18 ENCOUNTER — Other Ambulatory Visit: Payer: Medicare HMO | Admitting: Urology

## 2015-10-22 ENCOUNTER — Encounter: Payer: Self-pay | Admitting: Urology

## 2015-10-22 ENCOUNTER — Ambulatory Visit (INDEPENDENT_AMBULATORY_CARE_PROVIDER_SITE_OTHER): Payer: Medicare HMO | Admitting: Urology

## 2015-10-22 VITALS — BP 142/83 | HR 85 | Ht 69.0 in | Wt 179.8 lb

## 2015-10-22 DIAGNOSIS — C670 Malignant neoplasm of trigone of bladder: Secondary | ICD-10-CM | POA: Diagnosis not present

## 2015-10-22 LAB — URINALYSIS, COMPLETE
BILIRUBIN UA: NEGATIVE
Glucose, UA: NEGATIVE
Ketones, UA: NEGATIVE
LEUKOCYTES UA: NEGATIVE
Nitrite, UA: NEGATIVE
PH UA: 6 (ref 5.0–7.5)
Specific Gravity, UA: 1.01 (ref 1.005–1.030)
UUROB: 0.2 mg/dL (ref 0.2–1.0)

## 2015-10-22 LAB — MICROSCOPIC EXAMINATION
BACTERIA UA: NONE SEEN
Epithelial Cells (non renal): NONE SEEN /hpf (ref 0–10)
RBC, UA: NONE SEEN /hpf (ref 0–?)
WBC UA: NONE SEEN /HPF (ref 0–?)

## 2015-10-22 MED ORDER — LIDOCAINE HCL 2 % EX GEL
1.0000 "application " | Freq: Once | CUTANEOUS | Status: AC
  Administered 2015-10-22: 1 via URETHRAL

## 2015-10-22 MED ORDER — CIPROFLOXACIN HCL 500 MG PO TABS
500.0000 mg | ORAL_TABLET | Freq: Once | ORAL | Status: AC
  Administered 2015-10-22: 500 mg via ORAL

## 2015-10-22 NOTE — Progress Notes (Signed)
Donald Decker 10-Jun-1946 [id number]  Referring provider: Marden Noble, MD [address] [place], [place] [address]  Chief Complaint  Patient presents with  . 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 Treated with bladder-sparing protocol (refused cystectomy) with Gem-Cis Chemo 2015, then further chemo-radiation ending 02/2015.  Recent Course: 04/2015 Cysto - small dome erythema, no frank tumors. 10/2015 Cysto - small dome erythema, no frank tumors.  Today "Donald Decker" is seen for cysto and f/u above. No interval gross hematuria.    PMH: Past Medical History  Diagnosis Date  . Bladder neoplasm   . Hypertension   . History of TIA (transient ischemic attack)     2013--  NO RESIDUAL  . GERD (gastroesophageal reflux disease)   . Lower urinary tract symptoms (LUTS)   . Bladder cancer   . Aortic valve calcification     Surgical History: Past Surgical History  Procedure Laterality Date  . Cholecystectomy open  AGE 70  . Negative sleep study  2013  . Transurethral resection of bladder tumor N/A 07/24/2014    Procedure: TRANSURETHRAL RESECTION OF BLADDER TUMOR (TURBT) GREATER THAN 5 CMS;  Surgeon: Festus Aloe, MD;  Location: [facility];  Service: Urology;  Laterality: N/A;    Home Medications:    Medication List       This list is accurate as of: 05/17/15 11:59 PM.  Always use your most recent med list.               aspirin EC 81 MG tablet  Take 81 mg by mouth daily.     diazepam 5 MG tablet  Commonly known as:  VALIUM  Take 5 mg by mouth daily.     losartan-hydrochlorothiazide 100-25 MG per tablet  Commonly known as:  HYZAAR  Take 1 tablet by mouth every evening.     MULTIVITAMIN ADULTS 50+ PO  Take 1 tablet by mouth daily.     oxybutynin 5 MG tablet  Commonly known as:  DITROPAN  TAKE  1 TABLET BY MOUTH EVERY 8 HOURS AS NEEDED FOR BLADDER SPASMS     pantoprazole 40 MG tablet  Commonly known as:  PROTONIX  Take 1 tablet (40 mg total) by mouth every evening.     prochlorperazine 10 MG tablet  Commonly known as:  COMPAZINE  Take 1 tablet (10 mg total) by mouth every 6 (six) hours as needed for nausea or vomiting.     sertraline 100 MG tablet  Commonly known as:  ZOLOFT  Take 100 mg by mouth every evening.        Allergies:  Allergies  Allergen Reactions  . Latex Other (See Comments)    BLISTERING/ BRUISEING  . Tagamet [Cimetidine] Hives    Family History: Family History  Problem Relation Age of Onset  . Heart Problems Mother   . Stroke Mother     Social History:  reports that he has been smoking Cigarettes.  He has a 25 pack-year smoking history. He has never used smokeless tobacco. He reports that he drinks about 4.2 oz of alcohol per week. He reports that he does not use illicit drugs.   Physical Exam: BP 141/85 mmHg  Pulse 87  Wt 186 lb 14.4 oz (84.777 kg)  Constitutional:  Alert and oriented, No acute distress. HEENT: [place] AT,  moist mucus membranes.  Trachea midline, no masses. Cardiovascular: No clubbing, cyanosis, or edema. Respiratory: Normal respiratory effort, no increased work of breathing. GI: Abdomen is soft, nontender, nondistended, no abdominal masses GU: No CVA tenderness. Normal phallus with orthotopic meatus. Skin: No rashes, bruises or suspicious lesions. Neurologic: Grossly intact, no focal deficits, moving all 4 extremities. Psychiatric: Normal mood and affect.  Laboratory Data: Lab Results  Component Value Date   WBC 7.4 04/25/2015   HGB 13.9 04/25/2015   HCT 41.3 04/25/2015   MCV 92.8 04/25/2015   PLT 253 04/25/2015    Lab Results  Component Value Date   CREATININE 1.17 04/25/2015   Urinalysis Results for orders placed or performed in visit on 05/17/15  Microscopic Examination  Result Value Ref Range   WBC, UA 0-5 0  -  5 /hpf   RBC, UA 3-10 (A) 0 -  2 /hpf   Epithelial Cells (non renal) >10 (A) 0 - 10 /hpf   Renal Epithel, UA None seen None seen /hpf   Bacteria, UA None seen None seen/Few  Urinalysis, Complete  Result Value Ref Range   Specific Gravity, UA 1.015 1.005 - 1.030   pH, UA 7.0 5.0 - 7.5   Color, UA Yellow Yellow   Appearance Ur Clear Clear   Leukocytes, UA Negative Negative   Protein, UA Negative Negative/Trace   Glucose, UA Negative Negative   Ketones, UA Negative Negative   RBC, UA Trace (A) Negative   Bilirubin, UA Negative Negative   Urobilinogen, Ur 0.2 0.2 - 1.0 mg/dL   Nitrite, UA Negative Negative   Microscopic Examination See below:     Pertinent Imaging: No new recent imaging   Cystoscopy Procedure Note  Patient identification was confirmed, informed consent was obtained, and patient was prepped using Betadine solution.  Lidocaine jelly was administered per urethral meatus.    Preoperative abx where received prior to procedure.     Pre-Procedure: - Inspection reveals a normal caliber ureteral meatus.  Procedure: The flexible cystoscope was introduced without difficulty - No urethral strictures/lesions are present. - Normal prostate  - Normal bladder neck - Bilateral ureteral orifices identified - Bladder mucosa  reveals no ulcers, tumors, or lesions.  Very subtle patch of hypervascularity near dome of bladder at the site of previous resection but not otherwise suspicious. There is also a hypopigmented scar just adjacent to that area presumably the site of previous resection. - No bladder stones - No trabeculation  Retroflexion shows no significant median lobe.   Post-Procedure: - Patient tolerated the procedure well   Assessment & Plan:   1. 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 IF he recurrs, there is little to offer other than palliative-intent TURBT or  possible referral for Tecentriq.   2 - RTC 47mos for cysto, CT, CXR, CMP prior.   [facility] [address] [place], [place] [address] [phone]

## 2015-11-04 NOTE — Telephone Encounter (Signed)
Encounter created in error

## 2015-11-07 ENCOUNTER — Ambulatory Visit: Payer: Medicare HMO | Attending: Radiation Oncology | Admitting: Radiation Oncology

## 2015-12-26 ENCOUNTER — Ambulatory Visit: Payer: Medicare HMO | Admitting: Oncology

## 2015-12-26 ENCOUNTER — Other Ambulatory Visit: Payer: Medicare HMO

## 2015-12-30 ENCOUNTER — Inpatient Hospital Stay: Payer: Medicare HMO

## 2015-12-30 ENCOUNTER — Inpatient Hospital Stay: Payer: Medicare HMO | Attending: Oncology | Admitting: Oncology

## 2015-12-30 ENCOUNTER — Encounter: Payer: Self-pay | Admitting: Oncology

## 2015-12-30 VITALS — BP 135/82 | HR 70 | Temp 98.4°F | Ht 69.0 in

## 2015-12-30 DIAGNOSIS — Z7982 Long term (current) use of aspirin: Secondary | ICD-10-CM | POA: Diagnosis not present

## 2015-12-30 DIAGNOSIS — K219 Gastro-esophageal reflux disease without esophagitis: Secondary | ICD-10-CM | POA: Insufficient documentation

## 2015-12-30 DIAGNOSIS — Z9221 Personal history of antineoplastic chemotherapy: Secondary | ICD-10-CM | POA: Diagnosis not present

## 2015-12-30 DIAGNOSIS — Z923 Personal history of irradiation: Secondary | ICD-10-CM

## 2015-12-30 DIAGNOSIS — Z79899 Other long term (current) drug therapy: Secondary | ICD-10-CM | POA: Insufficient documentation

## 2015-12-30 DIAGNOSIS — I10 Essential (primary) hypertension: Secondary | ICD-10-CM | POA: Insufficient documentation

## 2015-12-30 DIAGNOSIS — C670 Malignant neoplasm of trigone of bladder: Secondary | ICD-10-CM | POA: Insufficient documentation

## 2015-12-30 DIAGNOSIS — F17210 Nicotine dependence, cigarettes, uncomplicated: Secondary | ICD-10-CM | POA: Diagnosis not present

## 2015-12-30 DIAGNOSIS — Z8673 Personal history of transient ischemic attack (TIA), and cerebral infarction without residual deficits: Secondary | ICD-10-CM | POA: Diagnosis not present

## 2015-12-30 LAB — COMPREHENSIVE METABOLIC PANEL
ALT: 13 U/L — AB (ref 17–63)
AST: 16 U/L (ref 15–41)
Albumin: 4.5 g/dL (ref 3.5–5.0)
Alkaline Phosphatase: 54 U/L (ref 38–126)
Anion gap: 7 (ref 5–15)
BUN: 19 mg/dL (ref 6–20)
CO2: 26 mmol/L (ref 22–32)
CREATININE: 1.12 mg/dL (ref 0.61–1.24)
Calcium: 9.2 mg/dL (ref 8.9–10.3)
Chloride: 91 mmol/L — ABNORMAL LOW (ref 101–111)
GFR calc non Af Amer: >60 mL/min (ref >60)
Glucose, Bld: 97 mg/dL (ref 65–99)
Potassium: 4.3 mmol/L (ref 3.5–5.1)
SODIUM: 124 mmol/L — AB (ref 135–145)
Total Bilirubin: 0.7 mg/dL (ref 0.3–1.2)
Total Protein: 8.2 g/dL — ABNORMAL HIGH (ref 6.5–8.1)

## 2015-12-30 LAB — CBC WITH DIFFERENTIAL/PLATELET
BASOS ABS: 0 10*3/uL (ref 0–0.1)
Basophils Relative: 1 %
EOS ABS: 0.1 10*3/uL (ref 0–0.7)
Eosinophils Relative: 2 %
HCT: 38.8 % — ABNORMAL LOW (ref 40.0–52.0)
HEMOGLOBIN: 13.7 g/dL (ref 13.0–18.0)
LYMPHS ABS: 1.2 10*3/uL (ref 1.0–3.6)
LYMPHS PCT: 18 %
MCH: 31.2 pg (ref 26.0–34.0)
MCHC: 35.4 g/dL (ref 32.0–36.0)
MCV: 88.1 fL (ref 80.0–100.0)
Monocytes Absolute: 0.6 10*3/uL (ref 0.2–1.0)
Monocytes Relative: 8 %
NEUTROS PCT: 71 %
Neutro Abs: 5.1 10*3/uL (ref 1.4–6.5)
Platelets: 231 10*3/uL (ref 150–440)
RBC: 4.41 MIL/uL (ref 4.40–5.90)
RDW: 12.8 % (ref 11.5–14.5)
WBC: 7.1 10*3/uL (ref 3.8–10.6)

## 2015-12-30 NOTE — Progress Notes (Signed)
[facility]cer [facility] @ [facility] Telephone:(336) [phone]  Fax:(336) [phone]     Donald Decker OB: 1946/06/22  MR#: [id number]  FIJ#:[id number]  Patient Care Team: Derwood Kaplan, MD as PCP - General (Internal Medicine)  CHIEF COMPLAINT:  Chief Complaint  Patient presents with  . 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  Status post 3 cycles of chemotherapy with cis-platinum and gemcitabine (diagnosis in September of 2015) chemotherapy was done at outside institution in [facility] 2.Patient has refused cystectomy.  Would start radiation and chemotherapy (January 04, 2015) 3.  Patient has finished weekly cis-platinum 5 and radiation therapy in May of 2016   4,Recent cystoscopy in January of 2017 is completely normal no evidence of recurrent or progressive disease      INTERVAL HISTORY: Patient is here for further follow-up and treatment consideration.  No hematuria.  No abdominal pain.  Patient is doing very well.  Has finished radiation chemotherapy appetite has been stable.  Here to discuss further planning of treatment. Patient is here for ongoing evaluation and treatment consideration No hematuria Appetite has been stable Functional status remains stable Patient does not have any hematuria.  No dysuria.  He reports frequency no abdominal discomfort REVIEW OF SYSTEMS:    general status: .  No change in a performance status.  No chills.  No fever. HEENT: Alopecia.  No evidence of stomatitis Lungs: No cough or shortness of breath Cardiac: No chest pain or paroxysmal nocturnal dyspnea GI: No nausea no vomiting no diarrhea no abdominal pain Skin: No rash Lower extremity no swelling Neurological system: No tingling.  No numbness.  No other focal signs Musculoskeletal system no bony pains  As per HPI.  Otherwise, a complete review of systems is negatve.  PAST MEDICAL HISTORY: Past Medical History  Diagnosis Date  . Bladder neoplasm   . Hypertension   . History of TIA (transient ischemic attack)     2013--  NO RESIDUAL  . GERD (gastroesophageal reflux disease)   . Lower urinary tract symptoms (LUTS)   . Bladder cancer ([facility])   . Aortic valve calcification     PAST SURGICAL HISTORY: Past Surgical History  Procedure Laterality Date  . Cholecystectomy open  AGE 30  . Negative sleep study  2013  . Transurethral resection of bladder tumor N/A 07/24/2014    Procedure: TRANSURETHRAL RESECTION OF BLADDER TUMOR (TURBT) GREATER THAN 5 CMS;  Surgeon: Jerilee Field, MD;  Location: [facility];  Service: Urology;  Laterality: N/A;    FAMILY HISTORY Family History  Problem Relation Age of Onset  . Heart Problems Mother   . Stroke Mother     ADVANCED DIRECTIVES:Patient does have advance healthcare directive, Patient   does not desire to make any changes  HEALTH MAINTENANCE: Social History  Substance Use Topics  . Smoking status: Current Every Day Smoker -- 0.50 packs/day for 50 years    Types: Cigarettes  . Smokeless tobacco: Never Used  . Alcohol Use: 4.2 oz/week    7 Cans of beer per week     Comment: DAILY BEER      Allergies  Allergen Reactions  . Latex Other (See Comments)    BLISTERING/ BRUISEING  . Tagamet [Cimetidine] Hives    Current Outpatient Prescriptions  Medication Sig Dispense Refill  . aspirin EC 81  MG tablet Take 81 mg by mouth daily.    . cholecalciferol (VITAMIN D) 1000 units tablet Take 1,000 Units by mouth daily.    Marland Kitchen co-enzyme Q-10 30 MG capsule     . diazepam (VALIUM) 5 MG tablet Take 5 mg by mouth daily.  5  . losartan-hydrochlorothiazide (HYZAAR) 100-25 MG tablet Take 1 tablet by mouth every evening. 30 tablet 6  . Multiple Vitamins-Minerals (MULTIVITAMIN ADULTS 50+ PO) Take 1 tablet by mouth daily.    . pantoprazole (PROTONIX) 40  MG tablet Take 1 tablet (40 mg total) by mouth every evening. 30 tablet 6  . sertraline (ZOLOFT) 100 MG tablet Take 1 tablet (100 mg total) by mouth every evening. 30 tablet 6  . vitamin B-12 (CYANOCOBALAMIN) 100 MCG tablet Take 100 mcg by mouth daily.    . Vitamins A & D (VITAMIN A & D) 10000-400 units CAPS      No current facility-administered medications for this visit.    OBJECTIVE:  Filed Vitals:   12/30/15 1444  BP: 135/82  Pulse: 70  Temp: 98.4 F (36.9 C)     There is no weight on file to calculate BMI.    ECOG FS:1 - Symptomatic but completely ambulatory  PHYSICAL EXAM: General  status: Performance status is good.  Patient has not lost significant weight HEENT: No evidence of stomatitis. Sclera and conjunctivae :: No jaundice.   pale looking. Lungs: Air  entry equal on both sides.  No rhonchi.  No rales.  Cardiac: Heart sounds are normal.  No pericardial rub.  No murmur. Lymphatic system: Cervical, axillary, inguinal, lymph nodes not palpable GI: Abdomen is soft.  No ascites.  Liver spleen not palpable.  No tenderness.  Bowel sounds are within normal limit Lower extremity: No edema Neurological system: Higher functions, cranial nerves intact no evidence of peripheral neuropathy. Skin: No rash.  No ecchymosis.Marland Kitchen   LAB RESULTS:  Appointment on 12/30/2015  Component Date Value Ref Range Status  . WBC 12/30/2015 7.1  3.8 - 10.6 K/uL Final  . RBC 12/30/2015 4.41  4.40 - 5.90 MIL/uL Final  . Hemoglobin 12/30/2015 13.7  13.0 - 18.0 g/dL Final  . HCT 12/30/2015 38.8* 40.0 - 52.0 % Final  . MCV 12/30/2015 88.1  80.0 - 100.0 fL Final  . MCH 12/30/2015 31.2  26.0 - 34.0 pg Final  . MCHC 12/30/2015 35.4  32.0 - 36.0 g/dL Final  . RDW 12/30/2015 12.8  11.5 - 14.5 % Final  . Platelets 12/30/2015 231  150 - 440 K/uL Final  . Neutrophils Relative % 12/30/2015 71   Final  . Neutro Abs 12/30/2015 5.1  1.4 - 6.5 K/uL Final  . Lymphocytes Relative 12/30/2015 18   Final  . Lymphs Abs  12/30/2015 1.2  1.0 - 3.6 K/uL Final  . Monocytes Relative 12/30/2015 8   Final  . Monocytes Absolute 12/30/2015 0.6  0.2 - 1.0 K/uL Final  . Eosinophils Relative 12/30/2015 2   Final  . Eosinophils Absolute 12/30/2015 0.1  0 - 0.7 K/uL Final  . Basophils Relative 12/30/2015 1   Final  . Basophils Absolute 12/30/2015 0.0  0 - 0.1 K/uL Final  . Sodium 12/30/2015 124* 135 - 145 mmol/L Final  . Potassium 12/30/2015 4.3  3.5 - 5.1 mmol/L Final  . Chloride 12/30/2015 91* 101 - 111 mmol/L Final  . CO2 12/30/2015 26  22 - 32 mmol/L Final  . Glucose, Bld 12/30/2015 97  65 - 99 mg/dL Final  .  BUN 12/30/2015 19  6 - 20 mg/dL Final  . Creatinine, Ser 12/30/2015 1.12  0.61 - 1.24 mg/dL Final  . Calcium 12/30/2015 9.2  8.9 - 10.3 mg/dL Final  . Total Protein 12/30/2015 8.2* 6.5 - 8.1 g/dL Final  . Albumin 12/30/2015 4.5  3.5 - 5.0 g/dL Final  . AST 12/30/2015 16  15 - 41 U/L Final  . ALT 12/30/2015 13* 17 - 63 U/L Final  . Alkaline Phosphatase 12/30/2015 54  38 - 126 U/L Final  . Total Bilirubin 12/30/2015 0.7  0.3 - 1.2 mg/dL Final  . GFR calc non Af Amer 12/30/2015 >60  >60 mL/min Final  . GFR calc Af Amer 12/30/2015 >60  >60 mL/min Final   Comment: (NOTE) The eGFR has been calculated using the CKD EPI equation. This calculation has not been validated in all clinical situations. eGFR's persistently <60 mL/min signify possible Chronic Kidney Disease.   . Anion gap 12/30/2015 7  5 - 15 Final       ASSESSMENT: Carcinoma of bladder T3 N0 M0 tumor status post chemoradiation therapy patient has refused cystectomy (patient also had neoadjuvant chemotherapy is outside institution) Patient had a small cell component with adenocarcinoma Overall improving performance status p  MEDICAL DECISION MAKING:  All lab data has been reviewed. Report of cystoscopy has been reviewed there is no evidence of recurrent disease. Next CT scan has been scheduled in 6 months in July. Appointment has been  scheduled to see urologist for repeat cystoscopy Because of my planned retirement patient would be seen by Dr. B in 6 months for follow-up.    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

## 2016-01-15 ENCOUNTER — Telehealth: Payer: Self-pay | Admitting: *Deleted

## 2016-01-15 NOTE — Telephone Encounter (Signed)
Referral faxed to [facility]

## 2016-01-15 NOTE — Telephone Encounter (Signed)
Wife called requesting home Health PT referral. They need a referral faxed over if in agreement

## 2016-04-16 ENCOUNTER — Ambulatory Visit
Admission: RE | Admit: 2016-04-16 | Discharge: 2016-04-16 | Disposition: A | Discharge status: Home / Self Care | Payer: Medicare HMO | Source: Ambulatory Visit | Attending: Urology | Admitting: Urology

## 2016-04-16 DIAGNOSIS — C670 Malignant neoplasm of trigone of bladder: Secondary | ICD-10-CM | POA: Insufficient documentation

## 2016-04-16 DIAGNOSIS — R911 Solitary pulmonary nodule: Secondary | ICD-10-CM | POA: Insufficient documentation

## 2016-04-16 LAB — POCT I-STAT CREATININE: CREATININE: 1.2 mg/dL (ref 0.61–1.24)

## 2016-04-16 MED ORDER — IOPAMIDOL (ISOVUE-300) INJECTION 61%
125.0000 mL | Freq: Once | INTRAVENOUS | Status: AC | PRN
  Administered 2016-04-16: 125 mL via INTRAVENOUS

## 2016-04-23 ENCOUNTER — Ambulatory Visit: Payer: Medicare HMO

## 2016-04-28 ENCOUNTER — Other Ambulatory Visit: Payer: Medicare HMO | Admitting: Urology

## 2016-04-29 ENCOUNTER — Ambulatory Visit (INDEPENDENT_AMBULATORY_CARE_PROVIDER_SITE_OTHER): Payer: Medicare HMO | Admitting: Urology

## 2016-04-29 VITALS — BP 147/84 | HR 96 | Ht 68.0 in | Wt 185.0 lb

## 2016-04-29 DIAGNOSIS — C670 Malignant neoplasm of trigone of bladder: Secondary | ICD-10-CM

## 2016-04-29 LAB — URINALYSIS, COMPLETE
Bilirubin, UA: NEGATIVE
GLUCOSE, UA: NEGATIVE
KETONES UA: NEGATIVE
LEUKOCYTES UA: NEGATIVE
NITRITE UA: NEGATIVE
SPEC GRAV UA: 1.02 (ref 1.005–1.030)
Urobilinogen, Ur: 0.2 mg/dL (ref 0.2–1.0)
pH, UA: 6 (ref 5.0–7.5)

## 2016-04-29 LAB — MICROSCOPIC EXAMINATION

## 2016-04-29 MED ORDER — CIPROFLOXACIN HCL 500 MG PO TABS
500.0000 mg | ORAL_TABLET | Freq: Once | ORAL | Status: AC
  Administered 2016-04-29: 500 mg via ORAL

## 2016-04-29 MED ORDER — LIDOCAINE HCL 2 % EX GEL
1.0000 "application " | Freq: Once | CUTANEOUS | Status: AC
  Administered 2016-04-29: 1 via URETHRAL

## 2016-04-29 NOTE — Progress Notes (Signed)
Donald Decker 01-05-1946 [id number]   04/29/16  Referring provider: Marden Noble, MD [address] [place], [place] [address]  Chief Complaint  Patient presents with  . 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 Treated with bladder-sparing protocol (refused cystectomy) with Gem-Cis Chemo 2015, then further chemo-radiation ending 02/2015.  Recent Course: 04/2015 Cysto - small dome erythema, no frank tumors. 10/2015 Cysto - small dome erythema, no frank tumors.  Today "[name]" is seen for cysto and f/u CT scan/ CXR. No interval gross hematuria.     PMH: Past Medical History  Diagnosis Date  . Bladder neoplasm   . Hypertension   . History of TIA (transient ischemic attack)     2013--  NO RESIDUAL  . GERD (gastroesophageal reflux disease)   . Lower urinary tract symptoms (LUTS)   . Bladder cancer   . Aortic valve calcification     Surgical History: Past Surgical History  Procedure Laterality Date  . Cholecystectomy open  AGE 70  . Negative sleep study  2013  . Transurethral resection of bladder tumor N/A 07/24/2014    Procedure: TRANSURETHRAL RESECTION OF BLADDER TUMOR (TURBT) GREATER THAN 5 CMS;  Surgeon: Festus Aloe, MD;  Location: [facility];  Service: Urology;  Laterality: N/A;    Home Medications:    Medication List       This list is accurate as of: 05/17/15 11:59 PM.  Always use your most recent med list.               aspirin EC 81 MG tablet  Take 81 mg by mouth daily.     diazepam 5 MG tablet  Commonly known as:  VALIUM  Take 5 mg by mouth daily.     losartan-hydrochlorothiazide 100-25 MG per tablet  Commonly known as:  HYZAAR  Take 1 tablet by mouth every evening.     MULTIVITAMIN ADULTS 50+ PO  Take 1 tablet by mouth daily.     oxybutynin 5 MG tablet  Commonly known  as:  DITROPAN  TAKE 1 TABLET BY MOUTH EVERY 8 HOURS AS NEEDED FOR BLADDER SPASMS     pantoprazole 40 MG tablet  Commonly known as:  PROTONIX  Take 1 tablet (40 mg total) by mouth every evening.     prochlorperazine 10 MG tablet  Commonly known as:  COMPAZINE  Take 1 tablet (10 mg total) by mouth every 6 (six) hours as needed for nausea or vomiting.     sertraline 100 MG tablet  Commonly known as:  ZOLOFT  Take 100 mg by mouth every evening.        Allergies:  Allergies  Allergen Reactions  . Latex Other (See Comments)    BLISTERING/ BRUISEING  . Tagamet [Cimetidine] Hives    Family History: Family History  Problem Relation Age of Onset  . Heart Problems Mother   . Stroke Mother     Social History:  reports that he has been smoking Cigarettes.  He has a 25 pack-year smoking history. He has never used smokeless tobacco. He reports that he drinks about 4.2 oz of alcohol per week. He reports that he does not use illicit drugs.   Physical Exam: BP 141/85 mmHg  Pulse 87  Wt 186 lb 14.4 oz (84.777 kg)  Constitutional:  Alert and oriented,  No acute distress. HEENT: [place] AT, moist mucus membranes.  Trachea midline, no masses. Cardiovascular: No clubbing, cyanosis, or edema. Respiratory: Normal respiratory effort, no increased work of breathing. GI: Abdomen is soft, nontender, nondistended, no abdominal masses GU: No CVA tenderness. Normal phallus with orthotopic meatus. Skin: No rashes, bruises or suspicious lesions. Neurologic: Grossly intact, no focal deficits, moving all 4 extremities. Psychiatric: Normal mood and affect.  Laboratory Data: Lab Results  Component Value Date   WBC 7.4 04/25/2015   HGB 13.9 04/25/2015   HCT 41.3 04/25/2015   MCV 92.8 04/25/2015   PLT 253 04/25/2015    Lab Results  Component Value Date   CREATININE 1.17 04/25/2015   Urinalysis Results for orders placed or performed in visit on 05/17/15  Microscopic Examination  Result Value Ref  Range   WBC, UA 0-5 0 -  5 /hpf   RBC, UA 3-10 (A) 0 -  2 /hpf   Epithelial Cells (non renal) >10 (A) 0 - 10 /hpf   Renal Epithel, UA None seen None seen /hpf   Bacteria, UA None seen None seen/Few  Urinalysis, Complete  Result Value Ref Range   Specific Gravity, UA 1.015 1.005 - 1.030   pH, UA 7.0 5.0 - 7.5   Color, UA Yellow Yellow   Appearance Ur Clear Clear   Leukocytes, UA Negative Negative   Protein, UA Negative Negative/Trace   Glucose, UA Negative Negative   Ketones, UA Negative Negative   RBC, UA Trace (A) Negative   Bilirubin, UA Negative Negative   Urobilinogen, Ur 0.2 0.2 - 1.0 mg/dL   Nitrite, UA Negative Negative   Microscopic Examination See below:     Pertinent Imaging: Study Result     CLINICAL DATA: Staging bladder cancer  EXAM: CT ABDOMEN AND PELVIS WITHOUT AND WITH CONTRAST  TECHNIQUE: Multidetector CT imaging of the abdomen and pelvis was performed following the standard protocol before and following the bolus administration of intravenous contrast.  CONTRAST: 139mL ISOVUE-300 IOPAMIDOL (ISOVUE-300) INJECTION 61%  COMPARISON: PET-CT from 12/06/2014  FINDINGS: Lower chest: Stable nodular density within the posterior right lower lobe, image 13 of series 8. This measures approximately 9 mm. No pleural fluid.  Hepatobiliary: Mid no suspicious liver abnormality. Previous cholecystectomy. No biliary dilatation.  Pancreas: Normal appearance of the pancreas.  Spleen: Within normal limits in size and appearance.  Adrenals/Urinary Tract: The adrenal glands are normal. No nephrolithiasis or hydronephrosis identified. No hydronephrosis. On the delayed images there is symmetric opacification of the collecting systems an ureters. Thick walled urinary bladder is identified. A more focal area of thickening along the right side of the bladder base is identified and appears to enhance superficially measuring 1.6 cm, image 76 of series  4.  Stomach/Bowel: The stomach appears normal. No dilated loops of small bowel identified. Numerous distal colonic diverticula noted without acute inflammation.  Vascular/Lymphatic: No pathologically enlarged lymph nodes. No evidence of abdominal aortic aneurysm.  Reproductive: Prostate gland appears enlarged. Symmetric appearance of the seminal vesicles.  Other: There is no ascites or focal fluid collections within the abdomen or pelvis.  Musculoskeletal: Degenerative disc disease noted within the lumbar spine.  IMPRESSION: 1. There is a focal area of mural thickening and enhancement along the right side of bladder base measuring approximately 1.6 cm. Correlation with direct visualization for any signs or symptoms of tumor recurrence. 2. No evidence for local lymph node metastasis or distant metastatic disease. 3. Stable nodular density within the posterior right lung base.  Electronically Signed  By: Kerby Moors M.D.  On: 04/16/2016 12:20   Study Result     CLINICAL DATA: Bladder tumor.  EXAM: CHEST 2 VIEW  COMPARISON: 09/26/2014 .  FINDINGS: Mediastinum and hilar structures normal. Lungs are clear. Heart size normal. No pleural effusion pneumothorax. No acute bony abnormality .  IMPRESSION: No acute cardiopulmonary disease.   Electronically Signed  By: Marcello Moores Register  On: 04/16/2016 14:46   CT scan and chest x-ray were personally reviewed today.   Cystoscopy Procedure Note  Patient identification was confirmed, informed consent was obtained, and patient was prepped using Betadine solution.  Lidocaine jelly was administered per urethral meatus.    Preoperative abx where received prior to procedure.     Pre-Procedure: - Inspection reveals a normal caliber ureteral meatus.  Procedure: The flexible cystoscope was introduced without difficulty - No urethral strictures/lesions are present. - Normal prostate  - Normal  bladder neck - Bilateral ureteral orifices identified - Bladder mucosa  reveals no ulcers, tumors, or lesions. Progressive patch of hypervascularity near dome of bladder at the site of previous resection. There is also a hypopigmented scar just adjacent to that area presumably the site of previous resection.  There is also an irregular contour with slight discoloration ~1 cm of the surface of the right lateral bladder wall which is also appreciated on retroflexion. No frank tumors but certainly irregular in appearance.  - No bladder stones - No trabeculation  Retroflexion shows no significant median lobe.  Irregular right lateral wall area also appreciated on cystoscopy.   Post-Procedure: - Patient tolerated the procedure well   Assessment & Plan:   1. Malignant neoplasm of trigone of urinary bladder -  CT scan reviewed, no evidence of disease other than irregular right lateral bladder wall thickening measuring 1.6 cm. No evidence of adenopathy or visceral metastases. Chest x-ray is negative.  Cystoscopy today shows clinical correlated to CT scan finding with irregular surface of the right lateral bladder wall but no obvious tumor.  We discussed the cystoscopic findings in detail today. He was made aware that this could possibly represent recurrent disease. I've recommended biopsying the OR.  I explained that if this does represent recurrence of tumor, it could easily be resected. In addition, I did make patient aware that there are newer agents which are well tolerated, anti-PDL specifically for second line treatment as well as clinical trials.  At this point, the patient is not interested in TURBT or returning to the operating room for any reason.  In addition, he was quite adamant today that he would decline any further treatment including intravesical therapy, or second line therapies including biologic agents.  He is familiar with the natural history of bladder cancer which was reviewed  again today in detail. Discussion was lengthy.  He declined to allow his family to be present during our conversation today.  He would prefer to keep our conversations private and make his own medical decisions for himself. He is very rational and capable of making these decision.    At this point, since he will continue to decline biopsy and second line agents, I recommended repeating the cystoscopy and consideration of repeat imaging in 1 year unless he becomes symptomatic or elects to return sooner.  He is very pleased with this plan.  2 - RTC 12 mos for cysto.  [facility] [address] [place], [place] [address] [phone]

## 2016-05-04 ENCOUNTER — Encounter: Payer: Self-pay | Admitting: Urology

## 2016-06-02 ENCOUNTER — Other Ambulatory Visit: Payer: Self-pay | Admitting: *Deleted

## 2016-06-02 DIAGNOSIS — C679 Malignant neoplasm of bladder, unspecified: Secondary | ICD-10-CM

## 2016-06-03 ENCOUNTER — Ambulatory Visit: Payer: Medicare HMO

## 2016-06-04 ENCOUNTER — Inpatient Hospital Stay: Payer: Medicare HMO

## 2016-06-04 ENCOUNTER — Inpatient Hospital Stay: Payer: Medicare HMO | Admitting: Internal Medicine

## 2016-06-26 ENCOUNTER — Inpatient Hospital Stay: Payer: Medicare HMO

## 2016-06-26 ENCOUNTER — Inpatient Hospital Stay: Payer: Medicare HMO | Admitting: Internal Medicine

## 2016-07-17 ENCOUNTER — Inpatient Hospital Stay: Payer: Medicare HMO | Admitting: Internal Medicine

## 2016-07-17 ENCOUNTER — Inpatient Hospital Stay: Payer: Medicare HMO

## 2016-07-20 ENCOUNTER — Inpatient Hospital Stay: Payer: Medicare HMO | Attending: Internal Medicine

## 2016-07-20 ENCOUNTER — Encounter: Payer: Self-pay | Admitting: Internal Medicine

## 2016-07-20 ENCOUNTER — Inpatient Hospital Stay (HOSPITAL_BASED_OUTPATIENT_CLINIC_OR_DEPARTMENT_OTHER): Payer: Medicare HMO | Admitting: Internal Medicine

## 2016-07-20 VITALS — BP 142/88 | HR 84 | Temp 96.7°F | Resp 17 | Ht 68.0 in | Wt 180.1 lb

## 2016-07-20 DIAGNOSIS — R5383 Other fatigue: Secondary | ICD-10-CM

## 2016-07-20 DIAGNOSIS — Z8673 Personal history of transient ischemic attack (TIA), and cerebral infarction without residual deficits: Secondary | ICD-10-CM | POA: Diagnosis not present

## 2016-07-20 DIAGNOSIS — K219 Gastro-esophageal reflux disease without esophagitis: Secondary | ICD-10-CM | POA: Insufficient documentation

## 2016-07-20 DIAGNOSIS — Z85828 Personal history of other malignant neoplasm of skin: Secondary | ICD-10-CM | POA: Diagnosis not present

## 2016-07-20 DIAGNOSIS — I700 Atherosclerosis of aorta: Secondary | ICD-10-CM | POA: Insufficient documentation

## 2016-07-20 DIAGNOSIS — I10 Essential (primary) hypertension: Secondary | ICD-10-CM | POA: Diagnosis not present

## 2016-07-20 DIAGNOSIS — Z79899 Other long term (current) drug therapy: Secondary | ICD-10-CM | POA: Insufficient documentation

## 2016-07-20 DIAGNOSIS — C670 Malignant neoplasm of trigone of bladder: Secondary | ICD-10-CM | POA: Diagnosis not present

## 2016-07-20 DIAGNOSIS — G4700 Insomnia, unspecified: Secondary | ICD-10-CM | POA: Diagnosis not present

## 2016-07-20 DIAGNOSIS — Z923 Personal history of irradiation: Secondary | ICD-10-CM | POA: Diagnosis not present

## 2016-07-20 DIAGNOSIS — C679 Malignant neoplasm of bladder, unspecified: Secondary | ICD-10-CM

## 2016-07-20 DIAGNOSIS — F17210 Nicotine dependence, cigarettes, uncomplicated: Secondary | ICD-10-CM | POA: Insufficient documentation

## 2016-07-20 DIAGNOSIS — Z9221 Personal history of antineoplastic chemotherapy: Secondary | ICD-10-CM | POA: Diagnosis not present

## 2016-07-20 LAB — CBC WITH DIFFERENTIAL/PLATELET
BASOS ABS: 0 10*3/uL (ref 0–0.1)
Basophils Relative: 0 %
EOS PCT: 3 %
Eosinophils Absolute: 0.2 10*3/uL (ref 0–0.7)
HEMATOCRIT: 38.2 % — AB (ref 40.0–52.0)
Hemoglobin: 13.6 g/dL (ref 13.0–18.0)
LYMPHS PCT: 13 %
Lymphs Abs: 0.7 10*3/uL — ABNORMAL LOW (ref 1.0–3.6)
MCH: 30.3 pg (ref 26.0–34.0)
MCHC: 35.5 g/dL (ref 32.0–36.0)
MCV: 85.2 fL (ref 80.0–100.0)
Monocytes Absolute: 0.4 10*3/uL (ref 0.2–1.0)
Monocytes Relative: 8 %
NEUTROS ABS: 4.1 10*3/uL (ref 1.4–6.5)
Neutrophils Relative %: 76 %
PLATELETS: 289 10*3/uL (ref 150–440)
RBC: 4.48 MIL/uL (ref 4.40–5.90)
RDW: 12.6 % (ref 11.5–14.5)
WBC: 5.5 10*3/uL (ref 3.8–10.6)

## 2016-07-20 LAB — COMPREHENSIVE METABOLIC PANEL
ALT: 16 U/L — AB (ref 17–63)
AST: 18 U/L (ref 15–41)
Albumin: 4.4 g/dL (ref 3.5–5.0)
Alkaline Phosphatase: 59 U/L (ref 38–126)
Anion gap: 7 (ref 5–15)
BUN: 27 mg/dL — AB (ref 6–20)
CHLORIDE: 96 mmol/L — AB (ref 101–111)
CO2: 25 mmol/L (ref 22–32)
CREATININE: 1.27 mg/dL — AB (ref 0.61–1.24)
Calcium: 9.7 mg/dL (ref 8.9–10.3)
GFR calc Af Amer: >60 mL/min (ref >60)
GFR, EST NON AFRICAN AMERICAN: 55 mL/min — AB (ref >60)
GLUCOSE: 111 mg/dL — AB (ref 65–99)
Potassium: 3.9 mmol/L (ref 3.5–5.1)
Sodium: 128 mmol/L — ABNORMAL LOW (ref 135–145)
Total Bilirubin: 0.6 mg/dL (ref 0.3–1.2)
Total Protein: 8 g/dL (ref 6.5–8.1)

## 2016-07-20 LAB — URINALYSIS COMPLETE WITH MICROSCOPIC (ARMC ONLY)
BACTERIA UA: NONE SEEN
Bilirubin Urine: NEGATIVE
Glucose, UA: NEGATIVE mg/dL
KETONES UR: NEGATIVE mg/dL
Leukocytes, UA: NEGATIVE
Nitrite: NEGATIVE
PROTEIN: NEGATIVE mg/dL
Specific Gravity, Urine: 1.015 (ref 1.005–1.030)
pH: 5 (ref 5.0–8.0)

## 2016-07-20 MED ORDER — MIRTAZAPINE 15 MG PO TABS: 15.0000 mg | ORAL_TABLET | Freq: Every day | ORAL | 6 refills | Status: DC

## 2016-07-20 NOTE — Assessment & Plan Note (Addendum)
#   Bladder ca- muscle invasive- s/p chemo-RT [2015]; most recent cystoscopy- July 2017- Cystoscopy- clinical correlated to CT scan finding with irregular surface of the right lateral bladder wall but no obvious tumor. Declined Biopsy in OR. Plan follow up with Dr.Brandon/ CT scans in July 2018.   # fatigue- add TSH. Labs look okay- except for chronic hyponatremia/ ckd [] creat 1.27].   # Insomnia- remeron 15 mg qhs.   # follow up in 1 year/awaiting CT scan [reluctant on closer follow ups].

## 2016-07-20 NOTE — Progress Notes (Signed)
[facility] OFFICE PROGRESS NOTE  Patient Care Team: Marden Noble, MD as PCP - General (Internal Medicine)  Malignant neoplasm of trigone of urinary bladder [facility])   Staging form: Urinary Bladder, AJCC 7th Edition   - Clinical: Stage III (T3, N0, M0) - Signed by Wyatt Portela, MD on 08/09/2014   Oncology History   Carcinoma of bladder T3 N0 M0 tumor stage II biopsy is transistor cell carcinoma muscle invasive with 50% or more component of small cell.  Status post 3 cycles of chemotherapy with cis-platinum and gemcitabine (diagnosis in September of 2015) chemotherapy was done at outside institution in [facility] 2.Patient has refused cystectomy.  Would start radiation and chemotherapy (January 04, 2015) 3. Weekly cis-platinum 5 and radiation therapy in May of 2016  # limited mobility sec to Stroke/Cane-wheel chair     Malignant neoplasm of trigone of urinary bladder ([place])   08/09/2014 Initial Diagnosis    Malignant neoplasm of trigone of urinary bladder       This is my first interaction with the patient as patient's primary oncologist has been Dr.Choksi. I reviewed the patient's prior charts/pertinent labs/imaging in detail; findings are summarized above.   INTERVAL HISTORY:  Donald Decker 70 y.o.  male pleasant patient above history of Muscle invasive bladder cancer stage II status post chemoradiation finished in May 2016; is currently for follow-up.  Patient denies any complaints. However family/wife and daughter- state the patient is fatigued all the time. Difficult to sleeping at night. No blood in urine. Denies any pain. No falls.  REVIEW OF SYSTEMS:  A complete 10 point review of system is done which is negative except mentioned above/history of present illness.   PAST MEDICAL HISTORY :  Past Medical History:  Diagnosis Date  . Aortic valve calcification   . Bladder cancer ([place])   . Bladder neoplasm   . GERD (gastroesophageal reflux  disease)   . History of TIA (transient ischemic attack)    2013--  NO RESIDUAL  . Hypertension   . Lower urinary tract symptoms (LUTS)     PAST SURGICAL HISTORY :   Past Surgical History:  Procedure Laterality Date  . CHOLECYSTECTOMY OPEN  AGE 4  . NEGATIVE SLEEP STUDY  2013  . TRANSURETHRAL RESECTION OF BLADDER TUMOR N/A 07/24/2014   Procedure: TRANSURETHRAL RESECTION OF BLADDER TUMOR (TURBT) GREATER THAN 5 CMS;  Surgeon: Festus Aloe, MD;  Location: [facility];  Service: Urology;  Laterality: N/A;    FAMILY HISTORY :   Family History  Problem Relation Age of Onset  . Heart Problems Mother   . Stroke Mother     SOCIAL HISTORY:   Social History  Substance Use Topics  . Smoking status: Current Every Day Smoker    Packs/day: 0.50    Years: 50.00    Types: Cigarettes  . Smokeless tobacco: Never Used  . Alcohol use 4.2 oz/week    7 Cans of beer per week     Comment: DAILY BEER    ALLERGIES:  is allergic to latex and tagamet [cimetidine].  MEDICATIONS:  Current Outpatient Prescriptions  Medication Sig Dispense Refill  . aspirin EC 81 MG tablet Take 81 mg by mouth daily.    . cholecalciferol (VITAMIN D) 1000 units tablet Take 1,000 Units by mouth daily.    Marland Kitchen co-enzyme Q-10 30 MG capsule     . diazepam (VALIUM) 5 MG tablet Take 5 mg by mouth daily.  5  .  losartan-hydrochlorothiazide (HYZAAR) 100-25 MG tablet Take 1 tablet by mouth every evening. 30 tablet 6  . Multiple Vitamins-Minerals (MULTIVITAMIN ADULTS 50+ PO) Take 1 tablet by mouth daily.    . pantoprazole (PROTONIX) 40 MG tablet Take 1 tablet (40 mg total) by mouth every evening. 30 tablet 6  . sertraline (ZOLOFT) 100 MG tablet Take 1 tablet (100 mg total) by mouth every evening. 30 tablet 6  . vitamin B-12 (CYANOCOBALAMIN) 100 MCG tablet Take 100 mcg by mouth daily.    . Vitamins A & D (VITAMIN A & D) 10000-400 units CAPS     . mirtazapine (REMERON) 15 MG tablet Take 1 tablet (15 mg total) by  mouth at bedtime. 30 tablet 6   No current facility-administered medications for this visit.     PHYSICAL EXAMINATION: ECOG PERFORMANCE STATUS: 0 - Asymptomatic  BP (!) 142/88 (BP Location: Left Arm, Patient Position: Sitting)   Pulse 84   Temp (!) 96.7 F (35.9 C) (Tympanic)   Resp 17   Ht 5\' 8"  (1.727 m)   Wt 180 lb 1.6 oz (81.7 kg)   BMI 27.38 kg/m   Filed Weights   07/20/16 1041  Weight: 180 lb 1.6 oz (81.7 kg)    GENERAL: Well-nourished well-developed; Alert, no distress and comfortable.   In a wheelchair. Accompanied by wife and daughter. EYES: no pallor or icterus OROPHARYNX: no thrush or ulceration; good dentition  NECK: supple, no masses felt LYMPH:  no palpable lymphadenopathy in the cervical, axillary or inguinal regions LUNGS: clear to auscultation and  No wheeze or crackles HEART/CVS: regular rate & rhythm and no murmurs; No lower extremity edema ABDOMEN:abdomen soft, non-tender and normal bowel sounds Musculoskeletal:no cyanosis of digits and no clubbing  PSYCH: alert & oriented x 3 with fluent speech NEURO: no focal motor/sensory deficits SKIN:  no rashes or significant lesions  LABORATORY DATA:  I have reviewed the data as listed    Component Value Date/Time   NA 128 (L) 07/20/2016 1030   NA 123 (L) 02/07/2015 1301   NA 130 (L) 10/23/2014 1414   K 3.9 07/20/2016 1030   K 4.7 02/07/2015 1301   K 5.4 (H) 10/23/2014 1414   CL 96 (L) 07/20/2016 1030   CL 90 (L) 02/07/2015 1301   CO2 25 07/20/2016 1030   CO2 26 02/07/2015 1301   CO2 26 10/23/2014 1414   GLUCOSE 111 (H) 07/20/2016 1030   GLUCOSE 107 (H) 02/07/2015 1301   GLUCOSE 104 10/23/2014 1414   BUN 27 (H) 07/20/2016 1030   BUN 17 02/07/2015 1301   BUN 16.2 10/23/2014 1414   CREATININE 1.27 (H) 07/20/2016 1030   CREATININE 0.99 02/07/2015 1301   CREATININE 1.05 01/17/2015   CREATININE 1.1 10/23/2014 1414   CALCIUM 9.7 07/20/2016 1030   CALCIUM 9.0 02/07/2015 1301   CALCIUM 9.7 10/23/2014  1414   PROT 8.0 07/20/2016 1030   PROT 7.3 02/07/2015 1301   PROT 7.5 10/23/2014 1414   ALBUMIN 4.4 07/20/2016 1030   ALBUMIN 4.0 02/07/2015 1301   ALBUMIN 4.1 10/23/2014 1414   AST 18 07/20/2016 1030   AST 14 (L) 02/07/2015 1301   AST 17 10/23/2014 1414   ALT 16 (L) 07/20/2016 1030   ALT 14 (L) 02/07/2015 1301   ALT 14 10/23/2014 1414   ALKPHOS 59 07/20/2016 1030   ALKPHOS 47 02/07/2015 1301   ALKPHOS 80 10/23/2014 1414   BILITOT 0.6 07/20/2016 1030   BILITOT 0.3 02/07/2015 1301   BILITOT 0.30 10/23/2014  [place] (L) 07/20/2016 1030   GFRNONAA >60 02/07/2015 1301   GFRAA >60 07/20/2016 1030   GFRAA >60 02/07/2015 1301    No results found for: SPEP, UPEP  Lab Results  Component Value Date   WBC 5.5 07/20/2016   NEUTROABS 4.1 07/20/2016   HGB 13.6 07/20/2016   HCT 38.2 (L) 07/20/2016   MCV 85.2 07/20/2016   PLT 289 07/20/2016      Chemistry      Component Value Date/Time   NA 128 (L) 07/20/2016 1030   NA 123 (L) 02/07/2015 1301   NA 130 (L) 10/23/2014 1414   K 3.9 07/20/2016 1030   K 4.7 02/07/2015 1301   K 5.4 (H) 10/23/2014 1414   CL 96 (L) 07/20/2016 1030   CL 90 (L) 02/07/2015 1301   CO2 25 07/20/2016 1030   CO2 26 02/07/2015 1301   CO2 26 10/23/2014 1414   BUN 27 (H) 07/20/2016 1030   BUN 17 02/07/2015 1301   BUN 16.2 10/23/2014 1414   CREATININE 1.27 (H) 07/20/2016 1030   CREATININE 0.99 02/07/2015 1301   CREATININE 1.05 01/17/2015   CREATININE 1.1 10/23/2014 1414      Component Value Date/Time   CALCIUM 9.7 07/20/2016 1030   CALCIUM 9.0 02/07/2015 1301   CALCIUM 9.7 10/23/2014 1414   ALKPHOS 59 07/20/2016 1030   ALKPHOS 47 02/07/2015 1301   ALKPHOS 80 10/23/2014 1414   AST 18 07/20/2016 1030   AST 14 (L) 02/07/2015 1301   AST 17 10/23/2014 1414   ALT 16 (L) 07/20/2016 1030   ALT 14 (L) 02/07/2015 1301   ALT 14 10/23/2014 1414   BILITOT 0.6 07/20/2016 1030   BILITOT 0.3 02/07/2015 1301   BILITOT 0.30 10/23/2014 1414        RADIOGRAPHIC STUDIES: I have personally reviewed the radiological images as listed and agreed with the findings in the report. No results found.   ASSESSMENT & PLAN:  Malignant neoplasm of trigone of urinary bladder ([place]) # Bladder ca- muscle invasive- s/p chemo-RT [2015]; most recent cystoscopy- July 2017- Cystoscopy- clinical correlated to CT scan finding with irregular surface of the right lateral bladder wall but no obvious tumor. Declined Biopsy in OR. Plan follow up with Dr.Brandon/ CT scans in July 2018.   # fatigue- add TSH. Labs look okay- except for chronic hyponatremia/ ckd [] creat 1.27].   # Insomnia- remeron 15 mg qhs.   # follow up in 1 year/awaiting CT scan [reluctant on closer follow ups].    Orders Placed This Encounter  Procedures  . CBC with Differential    Standing Status:   Future    Standing Expiration Date:   01/18/2018  . Comprehensive metabolic panel    Standing Status:   Future    Standing Expiration Date:   01/18/2018  . Urinalysis complete, with microscopic [facility])    Standing Status:   Future    Standing Expiration Date:   01/18/2018   All questions were answered. The patient knows to call the clinic with any problems, questions or concerns.      Cammie Sickle, MD 07/20/2016 12:51 PM

## 2016-09-19 ENCOUNTER — Emergency Department
Admission: EM | Admit: 2016-09-19 | Discharge: 2016-09-19 | Payer: Medicare HMO | Attending: Student in an Organized Health Care Education/Training Program | Admitting: Student in an Organized Health Care Education/Training Program

## 2016-09-19 ENCOUNTER — Emergency Department: Payer: Medicare HMO

## 2016-09-19 DIAGNOSIS — G935 Compression of brain: Secondary | ICD-10-CM

## 2016-09-19 DIAGNOSIS — R42 Dizziness and giddiness: Secondary | ICD-10-CM | POA: Diagnosis present

## 2016-09-19 DIAGNOSIS — Z79899 Other long term (current) drug therapy: Secondary | ICD-10-CM | POA: Diagnosis not present

## 2016-09-19 DIAGNOSIS — Z5181 Encounter for therapeutic drug level monitoring: Secondary | ICD-10-CM | POA: Diagnosis not present

## 2016-09-19 DIAGNOSIS — Z8551 Personal history of malignant neoplasm of bladder: Secondary | ICD-10-CM | POA: Diagnosis not present

## 2016-09-19 DIAGNOSIS — C719 Malignant neoplasm of brain, unspecified: Secondary | ICD-10-CM | POA: Insufficient documentation

## 2016-09-19 DIAGNOSIS — I10 Essential (primary) hypertension: Secondary | ICD-10-CM | POA: Diagnosis not present

## 2016-09-19 DIAGNOSIS — D496 Neoplasm of unspecified behavior of brain: Secondary | ICD-10-CM

## 2016-09-19 DIAGNOSIS — R2681 Unsteadiness on feet: Secondary | ICD-10-CM

## 2016-09-19 DIAGNOSIS — Z7982 Long term (current) use of aspirin: Secondary | ICD-10-CM | POA: Diagnosis not present

## 2016-09-19 DIAGNOSIS — F17210 Nicotine dependence, cigarettes, uncomplicated: Secondary | ICD-10-CM | POA: Diagnosis not present

## 2016-09-19 LAB — COMPREHENSIVE METABOLIC PANEL WITH GFR
ALT: 14 U/L — ABNORMAL LOW (ref 17–63)
AST: 17 U/L (ref 15–41)
Albumin: 4.8 g/dL (ref 3.5–5.0)
Alkaline Phosphatase: 60 U/L (ref 38–126)
Anion gap: 9 (ref 5–15)
BUN: 18 mg/dL (ref 6–20)
CO2: 29 mmol/L (ref 22–32)
Calcium: 9.8 mg/dL (ref 8.9–10.3)
Chloride: 94 mmol/L — ABNORMAL LOW (ref 101–111)
Creatinine, Ser: 1.4 mg/dL — ABNORMAL HIGH (ref 0.61–1.24)
GFR calc Af Amer: 57 mL/min — ABNORMAL LOW
GFR calc non Af Amer: 49 mL/min — ABNORMAL LOW
Glucose, Bld: 127 mg/dL — ABNORMAL HIGH (ref 65–99)
Potassium: 4.5 mmol/L (ref 3.5–5.1)
Sodium: 132 mmol/L — ABNORMAL LOW (ref 135–145)
Total Bilirubin: 0.4 mg/dL (ref 0.3–1.2)
Total Protein: 8.5 g/dL — ABNORMAL HIGH (ref 6.5–8.1)

## 2016-09-19 LAB — DIFFERENTIAL
Basophils Absolute: 0 K/uL (ref 0–0.1)
Basophils Relative: 1 %
Eosinophils Absolute: 0.1 K/uL (ref 0–0.7)
Eosinophils Relative: 1 %
Lymphocytes Relative: 13 %
Lymphs Abs: 1 K/uL (ref 1.0–3.6)
Monocytes Absolute: 0.5 K/uL (ref 0.2–1.0)
Monocytes Relative: 7 %
Neutro Abs: 6 K/uL (ref 1.4–6.5)
Neutrophils Relative %: 78 %

## 2016-09-19 LAB — TROPONIN I: Troponin I: <0.03 ng/mL

## 2016-09-19 LAB — CBC
HCT: 43.4 % (ref 40.0–52.0)
Hemoglobin: 15.1 g/dL (ref 13.0–18.0)
MCH: 30.1 pg (ref 26.0–34.0)
MCHC: 34.7 g/dL (ref 32.0–36.0)
MCV: 86.6 fL (ref 80.0–100.0)
PLATELETS: 265 10*3/uL (ref 150–440)
RBC: 5.01 MIL/uL (ref 4.40–5.90)
RDW: 13.2 % (ref 11.5–14.5)
WBC: 7.7 10*3/uL (ref 3.8–10.6)

## 2016-09-19 LAB — PROTIME-INR
INR: 0.92
PROTHROMBIN TIME: 12.3 s (ref 11.4–15.2)

## 2016-09-19 LAB — APTT: aPTT: 25 s (ref 24–36)

## 2016-09-19 MED ORDER — DEXAMETHASONE SODIUM PHOSPHATE 10 MG/ML IJ SOLN
10.0000 mg | Freq: Once | INTRAMUSCULAR | Status: AC
  Administered 2016-09-19: 10 mg via INTRAVENOUS
  Filled 2016-09-19: qty 1

## 2016-09-19 MED ORDER — NICOTINE 14 MG/24HR TD PT24
MEDICATED_PATCH | TRANSDERMAL | Status: AC
  Administered 2016-09-19: 14 mg via TRANSDERMAL
  Filled 2016-09-19: qty 1

## 2016-09-19 MED ORDER — NICOTINE 14 MG/24HR TD PT24
14.0000 mg | MEDICATED_PATCH | Freq: Once | TRANSDERMAL | Status: DC
  Administered 2016-09-19: 14 mg via TRANSDERMAL

## 2016-09-19 MED ORDER — LABETALOL HCL 5 MG/ML IV SOLN: 5.0000 mg | INTRAVENOUS | Status: DC | PRN

## 2016-09-19 MED ORDER — IOPAMIDOL (ISOVUE-300) INJECTION 61%
80.0000 mL | Freq: Once | INTRAVENOUS | Status: AC | PRN
  Administered 2016-09-19: 80 mL via INTRAVENOUS

## 2016-09-19 MED ORDER — IOPAMIDOL (ISOVUE-300) INJECTION 61%
15.0000 mL | INTRAVENOUS | Status: AC
  Administered 2016-09-19: 15 mL via ORAL

## 2016-09-19 NOTE — ED Provider Notes (Signed)
[facility] Emergency Department Provider Note    First MD Initiated Contact with Patient 09/19/16 1617     (approximate)  I have reviewed the triage vital signs and the nursing notes.   HISTORY  Chief Complaint Dizziness    HPI Donald Decker is a 70 y.o. male with a history of bladder cancer presents at the encouragement of his wife due to complaint of one week of worsening dizziness and right leg weakness preventing him to from walking. Patient's wife says that he's become more confused over the past few days. States that things started acutely on Saturday became acutely worse yesterday. Does have a history of bladder cancer but was told that he was in remission. He also has a remote history of TIA in 2013 with no residual deficits. He is on aspirin.   Past Medical History:  Diagnosis Date  . Aortic valve calcification   . Bladder cancer ([place])   . Bladder neoplasm   . GERD (gastroesophageal reflux disease)   . History of TIA (transient ischemic attack)    2013--  NO RESIDUAL  . Hypertension   . Lower urinary tract symptoms (LUTS)    Family History  Problem Relation Age of Onset  . Heart Problems Mother   . Stroke Mother    Past Surgical History:  Procedure Laterality Date  . CHOLECYSTECTOMY OPEN  AGE 5  . NEGATIVE SLEEP STUDY  2013  . TRANSURETHRAL RESECTION OF BLADDER TUMOR N/A 07/24/2014   Procedure: TRANSURETHRAL RESECTION OF BLADDER TUMOR (TURBT) GREATER THAN 5 CMS;  Surgeon: Festus Aloe, MD;  Location: [facility];  Service: Urology;  Laterality: N/A;   Patient Active Problem List   Diagnosis Date Noted  . Hyperlipidemia 12/23/2014  . Gait instability 11/02/2014  . Hypertension   . History of TIA (transient ischemic attack)   . Aortic valve calcification   . 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  Authorizing Provider  aspirin EC 81 MG tablet Take 81 mg by mouth daily.   Yes Historical Provider, MD  cholecalciferol (VITAMIN D) 1000 units tablet Take 1,000 Units by mouth daily.   Yes Historical Provider, MD  diazepam (VALIUM) 5 MG tablet Take 5 mg by mouth daily. 07/11/14  Yes Historical Provider, MD  losartan-hydrochlorothiazide (HYZAAR) 100-25 MG tablet Take 1 tablet by mouth every evening. 08/28/15  Yes Forest Gleason, MD  ondansetron (ZOFRAN) 4 MG tablet Take 1 tablet by mouth every 6 (six) hours. 09/03/16  Yes Historical Provider, MD  pantoprazole (PROTONIX) 40 MG tablet Take 1 tablet (40 mg total) by mouth every evening. 08/28/15  Yes Forest Gleason, MD  sertraline (ZOLOFT) 100 MG tablet Take 1 tablet (100 mg total) by mouth every evening. 08/28/15  Yes Forest Gleason, MD    Allergies Latex and Tagamet [cimetidine]    Social History Social History  Substance Use Topics  . Smoking status: Current Every Day Smoker    Packs/day: 0.50    Years: 50.00    Types: Cigarettes  . Smokeless tobacco: Never Used  . Alcohol use 4.2 oz/week    7 Cans of beer per week     Comment: DAILY BEER    Review of Systems Patient denies headaches, rhinorrhea, blurry vision, numbness, shortness of breath, chest pain, edema, cough, abdominal pain, nausea, vomiting, diarrhea, dysuria, fevers, rashes or hallucinations unless otherwise stated above  in HPI. ____________________________________________   PHYSICAL EXAM:  VITAL SIGNS: Vitals:   09/19/16 1647 09/19/16 1700  BP:  127/79  Pulse:  67  Resp:  15  Temp: 98.1 F (36.7 C)     Constitutional: Alert and oriented  in no acute distress. Eyes: Conjunctivae are normal. PERRL. EOMI. Head: Atraumatic. Nose: No congestion/rhinnorhea. Mouth/Throat: Mucous membranes are moist.  Oropharynx non-erythematous. Neck: No stridor. Painless ROM. No cervical spine tenderness to palpation Hematological/Lymphatic/Immunilogical: No cervical  lymphadenopathy. Cardiovascular: Normal rate, regular rhythm. Grossly normal heart sounds.  Good peripheral circulation. Respiratory: Normal respiratory effort.  No retractions. Lungs CTAB. Gastrointestinal: Soft and nontender. No distention. No abdominal bruits. No CVA tenderness. Musculoskeletal: No lower extremity tenderness nor edema.  No joint effusions. Neurologic:  GCS 14 Normal speech and language. No gross focal neurologic deficits are appreciated. No gait instability. Skin:  Skin is warm, dry and intact. No rash noted. Psychiatric: Mood and affect are normal. Speech and behavior are normal.  ____________________________________________   LABS (all labs ordered are listed, but only abnormal results are displayed)  Results for orders placed or performed during the hospital encounter of 09/19/16 (from the past 24 hour(s))  Protime-INR     Status: None   Collection Time: 09/19/16  3:11 PM  Result Value Ref Range   Prothrombin Time 12.3 11.4 - 15.2 seconds   INR 0.92   APTT     Status: None   Collection Time: 09/19/16  3:11 PM  Result Value Ref Range   aPTT 25 24 - 36 seconds  CBC     Status: None   Collection Time: 09/19/16  3:11 PM  Result Value Ref Range   WBC 7.7 3.8 - 10.6 K/uL   RBC 5.01 4.40 - 5.90 MIL/uL   Hemoglobin 15.1 13.0 - 18.0 g/dL   HCT 43.4 40.0 - 52.0 %   MCV 86.6 80.0 - 100.0 fL   MCH 30.1 26.0 - 34.0 pg   MCHC 34.7 32.0 - 36.0 g/dL   RDW 13.2 11.5 - 14.5 %   Platelets 265 150 - 440 K/uL  Differential     Status: None   Collection Time: 09/19/16  3:11 PM  Result Value Ref Range   Neutrophils Relative % 78 %   Neutro Abs 6.0 1.4 - 6.5 K/uL   Lymphocytes Relative 13 %   Lymphs Abs 1.0 1.0 - 3.6 K/uL   Monocytes Relative 7 %   Monocytes Absolute 0.5 0.2 - 1.0 K/uL   Eosinophils Relative 1 %   Eosinophils Absolute 0.1 0 - 0.7 K/uL   Basophils Relative 1 %   Basophils Absolute 0.0 0 - 0.1 K/uL  Comprehensive metabolic panel     Status: Abnormal    Collection Time: 09/19/16  3:11 PM  Result Value Ref Range   Sodium 132 (L) 135 - 145 mmol/L   Potassium 4.5 3.5 - 5.1 mmol/L   Chloride 94 (L) 101 - 111 mmol/L   CO2 29 22 - 32 mmol/L   Glucose, Bld 127 (H) 65 - 99 mg/dL   BUN 18 6 - 20 mg/dL   Creatinine, Ser 1.40 (H) 0.61 - 1.24 mg/dL   Calcium 9.8 8.9 - 10.3 mg/dL   Total Protein 8.5 (H) 6.5 - 8.1 g/dL   Albumin 4.8 3.5 - 5.0 g/dL   AST 17 15 - 41 U/L   ALT 14 (L) 17 - 63 U/L   Alkaline Phosphatase 60 38 - 126 U/L   Total Bilirubin 0.4 0.3 -  1.2 mg/dL   GFR calc non Af Amer 49 (L) >60 mL/min   GFR calc Af Amer 57 (L) >60 mL/min   Anion gap 9 5 - 15  Troponin I     Status: None   Collection Time: 09/19/16  3:11 PM  Result Value Ref Range   Troponin I <0.03 <0.03 ng/mL   ____________________________________________  EKG My review and personal interpretation at Time: 17:02   Indication: weakness  Rate: 70  Rhythm: sinus Axis: normal Other: non specific st changes, no acute ischemia ____________________________________________  RADIOLOGY  I personally reviewed all radiographic images ordered to evaluate for the above acute complaints and reviewed radiology reports and findings.  These findings were personally discussed with the patient.  Please see medical record for radiology report. ____________________________________________   PROCEDURES  Procedure(s) performed: none Procedures    Critical Care performed: yes CRITICAL CARE Performed by: Merlyn Lot   Total critical care time: 45 minutes  Critical care time was exclusive of separately billable procedures and treating other patients.  Critical care was necessary to treat or prevent imminent or life-threatening deterioration.  Critical care was time spent personally by me on the following activities: development of treatment plan with patient and/or surrogate as well as nursing, discussions with consultants, evaluation of patient's response to treatment,  examination of patient, obtaining history from patient or surrogate, ordering and performing treatments and interventions, ordering and review of laboratory studies, ordering and review of radiographic studies, pulse oximetry and re-evaluation of patient's condition.  ____________________________________________   INITIAL IMPRESSION / ASSESSMENT AND PLAN / ED COURSE  Pertinent labs & imaging results that were available during my care of the patient were reviewed by me and considered in my medical decision making (see chart for details).  DDX: Dehydration, sepsis, pna, uti, hypoglycemia, mass, cva, drug effect, withdrawal, encephalitis   TU BRITTING is a 70 y.o. who presents to the ED with report of difficulty walking, dizziness and weakness. Presentation was consistent with CVA. CT imaging ordered due to concern for mass versus bleed. 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 Patient otherwise hemodynamic stable. Blood work is reassuring and shows no evidence of acute abnormality that would cause his presentation. Spoke with patient regarding my concern given his probable metastatic brain disease and have recommended transfer to the [facility] for neurosurgical evaluation.  Have discussed with the patient and available family all diagnostics and treatments performed thus far and all questions were answered to the best of my ability. The patient demonstrates understanding and agreement with plan.   Clinical Course      ____________________________________________   FINAL CLINICAL IMPRESSION(S) / ED DIAGNOSES  Final diagnoses:  Neoplasm of brain causing mass effect on adjacent structures (HCC)  Dizziness  Unsteady gait      NEW MEDICATIONS STARTED DURING THIS VISIT:  New Prescriptions   No medications on file     Note:  This document was prepared using Dragon voice recognition software and  may include unintentional dictation errors.    Merlyn Lot, MD 09/19/16 [phone]

## 2016-09-19 NOTE — ED Notes (Signed)
Patient to CT scan via stretcher. AAOx3.  Skin warm and dry. NAD

## 2016-09-19 NOTE — ED Notes (Signed)
Return from CT Scan. AAOx3.  NAD

## 2016-09-19 NOTE — ED Notes (Signed)
Pt refuses EKG X 2 to this RN and ED Tech, [place].

## 2016-09-19 NOTE — ED Notes (Signed)
Pt refused EKG to this tech, pt states " I don't want that, I don't even want to be here" Rn Gerald Leitz) notified.

## 2016-09-19 NOTE — ED Triage Notes (Signed)
Pt arrives to ER via POV c/o dizziness and "unable to walk" for weeks per patient. Pt states that he is unsure why his wife brought him here today because nothing is new. Pt alert and oriented X4, active, cooperative, pt in NAD. RR even and unlabored, color WNL.

## 2016-09-19 NOTE — ED Triage Notes (Signed)
Pt started with left leg and arm weakness. Difficulty walking per daughter. Hx strokes. Has been dizzy. Sx started yesterday.

## 2016-10-26 ENCOUNTER — Other Ambulatory Visit: Payer: Self-pay | Admitting: Nurse Practitioner

## 2016-11-19 DEATH — deceased

## 2017-03-08 IMAGING — CT CT HEAD W/O CM
3 series · 14 of 45 positions shown, 16 images · non-contrast
Comparison: CT head 07/09/2013.  PET scan 12/06/2014.

CLINICAL DATA: LEFT leg and arm weakness, difficulty walking.
Dizziness. History of bladder cancer, clinical stage III.

EXAM:
CT HEAD WITHOUT CONTRAST
TECHNIQUE: Contiguous axial images were obtained from the base of the skull
through the vertex without intravenous contrast.

[Series 2: head wo · axial · 0.44mm/px · z∈[-126,-11]mm · 8 of 28 slices shown, 10 images]
[im 3/28  brain]
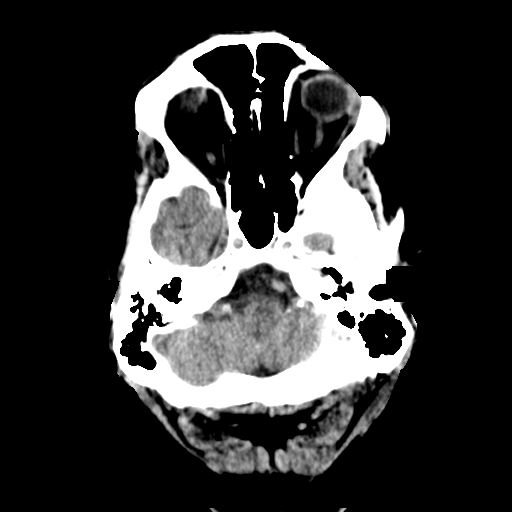
[im 3/28  bone]
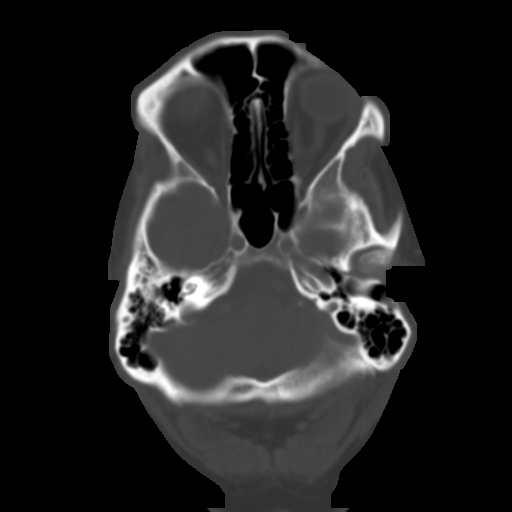
[im 6/28  brain]
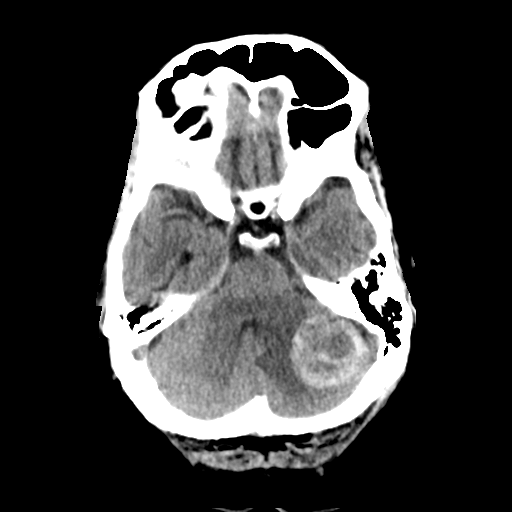
[im 10/28  brain]
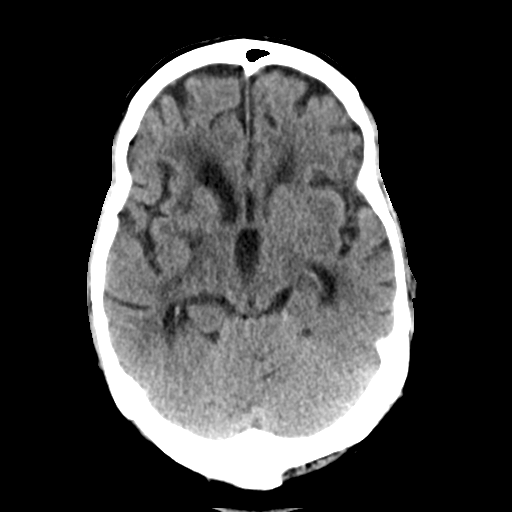
[im 13/28  brain]
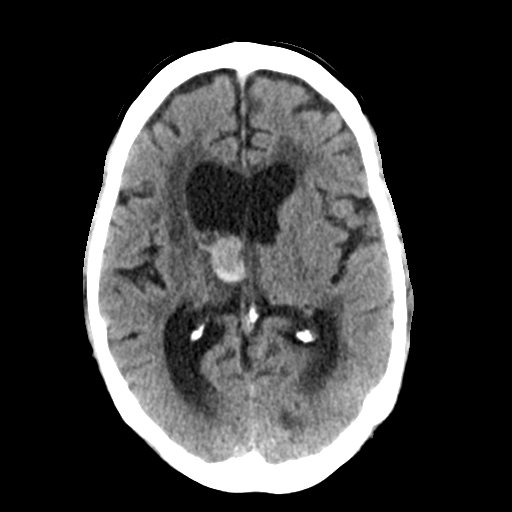
[im 16/28  brain]
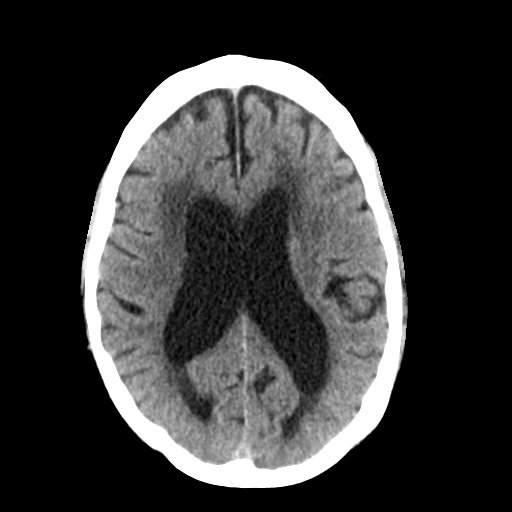
[im 16/28  bone]
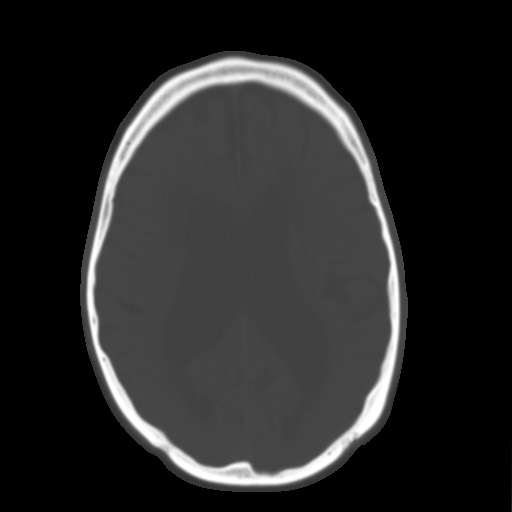
[im 19/28  brain]
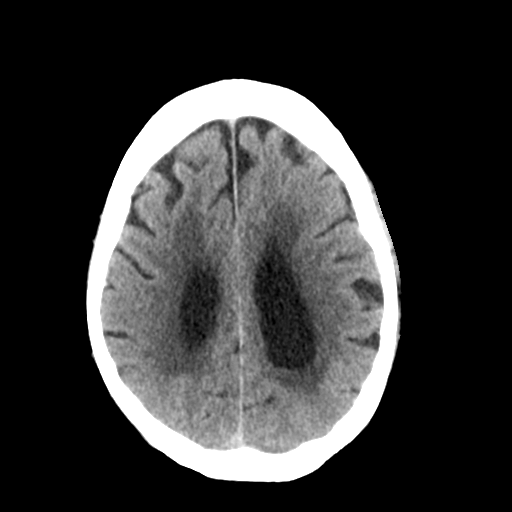
[im 23/28  brain]
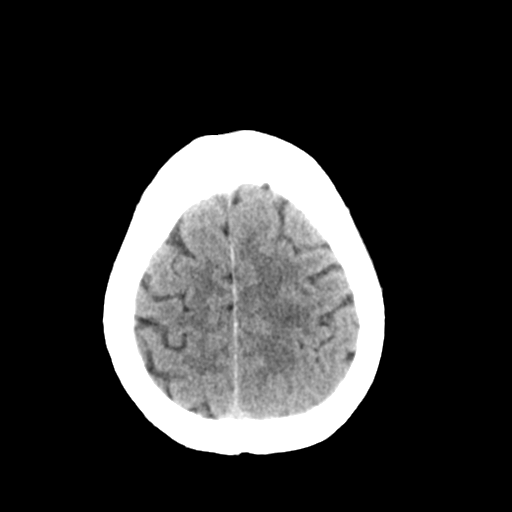
[im 26/28  brain]
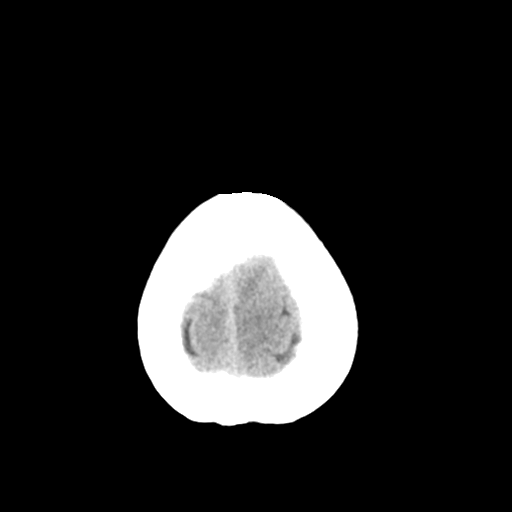

[Series 4: coronal soft tissue · coronal · 0.31mm/px · 3 of 71 slices shown]
[im 24/71  brain]
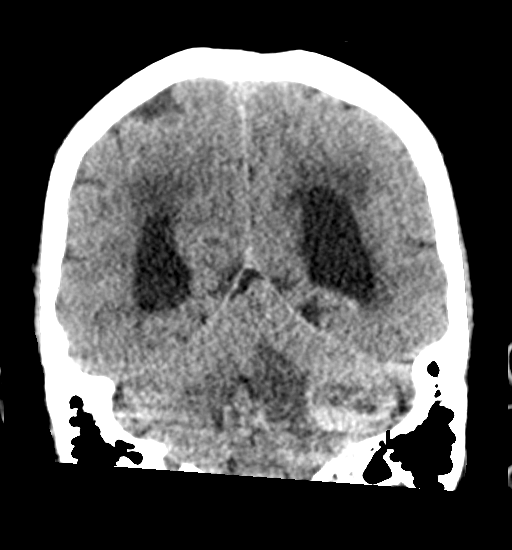
[im 32/71  brain]
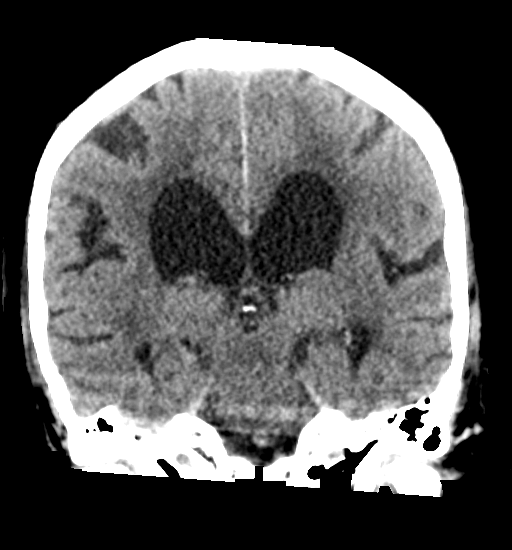
[im 39/71  brain]
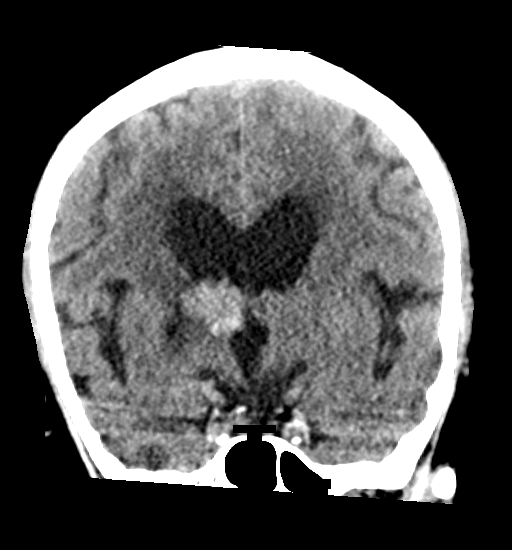

[Series 5: sagittal soft tissue · sagittal · 0.32mm/px · 3 of 58 slices shown]
[im 20/58  brain]
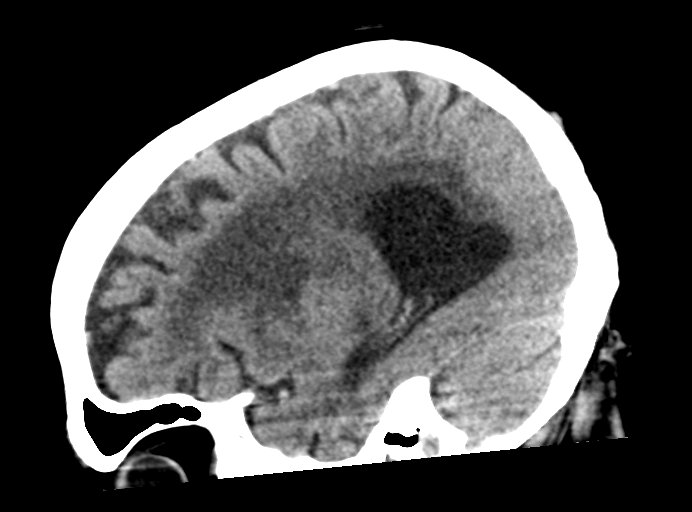
[im 29/58  brain]
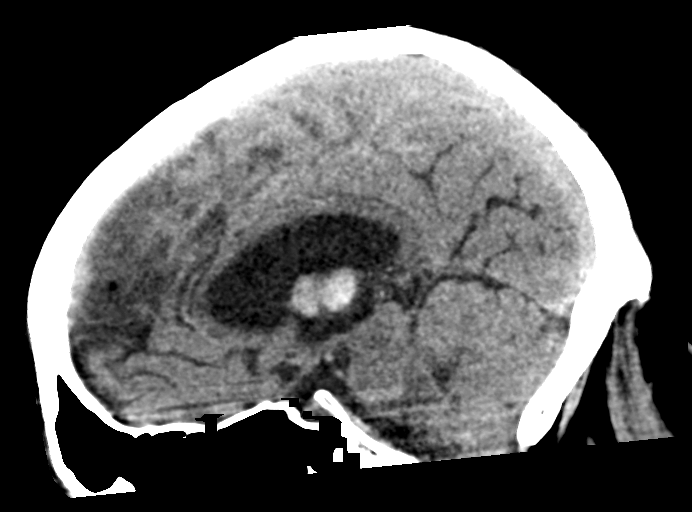
[im 39/58  brain]
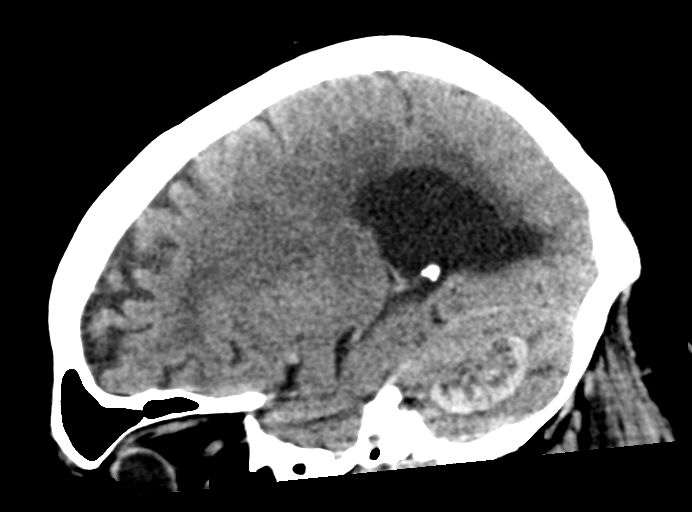

[14 of 45 positions shown; findings below may reference images not displayed]

FINDINGS: Brain: On this noncontrast brain exam, there are two large
intra-axial brain lesions with hyperattenuation, suggesting
hemorrhagic metastases from the patient's known bladder cancer. The
smaller lesion is located in the RIGHT thalamus, 12 x 24 x 17 mm
(R-L x A-P x C-C) projecting into the third ventricle and foramen of
Nicolas Jan, contributing to a small amount of intraventricular hemorrhage
layering in the occipital horns. Slight right-to-left mass effect.

The larger lesion is in the LEFT cerebellum, 34 x 30 x 27 mm, (R-L x
A-P x C-C), adjacent vasogenic edema, mild left-to-right mass effect
on the fourth ventricle, with central necrosis and areas of layering
hemorrhage within the tumor. No subarachnoid blood.

Global atrophy. Chronic microvascular ischemic change. Evidence for
old RIGHT basal ganglia infarction.

Vascular: No hyperdense vessel. Vascular calcification not
unexpected for age.

Skull: No lytic or sclerotic metastases to the calvarium or
skullbase. Scalp soft tissues unremarkable.

Sinuses/Orbits: Dense lenticular opacities. No layering sinus fluid.

Other: None.
IMPRESSION: Findings consistent with a 2 cm hemorrhagic RIGHT thalamic
metastasis and 3 cm hemorrhagic LEFT cerebellar metastasis, related
to the patient's known bladder cancer. If further investigation
desired, MRI the brain without and with contrast recommended.

Moderate mass effect from the larger LEFT cerebellar metastasis,
with vasogenic edema and effacement of fourth ventricle. The patient
potentially is at risk for acute obstructive hydrocephalus should
the lesion enlarge or bleed further. Neurosurgical consultation is
warranted.

Atrophy and small vessel disease.

These results were called by telephone at the time of interpretation
on 09/19/2016 at [DATE] to Dr. Jim , who verbally acknowledged
these results.

## 2017-03-08 IMAGING — CT CT ABD-PELV W/ CM
2 of 5 series · 12 of 36 positions shown, 15 images · IV contrast (iopamidol)
Comparison: 04/16/2016 CT abdomen/pelvis. 12/06/2014 PET-CT.
10/23/2014 CT chest, abdomen and pelvis.

CLINICAL DATA: 70-year-old male with a history of stage IIIB
bladder cancer diagnosed July 2014, presents for restaging with
newly diagnosed brain metastases on head CT study earlier today.

EXAM:
CT CHEST, ABDOMEN, AND PELVIS WITH CONTRAST
TECHNIQUE: Multidetector CT imaging of the chest, abdomen and pelvis was
performed following the standard protocol during bolus
administration of intravenous contrast.
CONTRAST:  80mL 45AL6L-YTT IOPAMIDOL (45AL6L-YTT) INJECTION 61%

[Series 2: cap with · axial · 0.75mm/px · z∈[-714,-184]mm · 9 of 130 slices shown, 12 images]
[im 12/130  mediastinal]
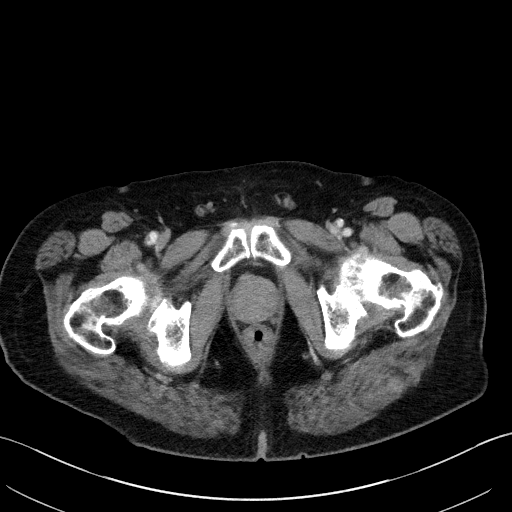
[im 12/130  lung]
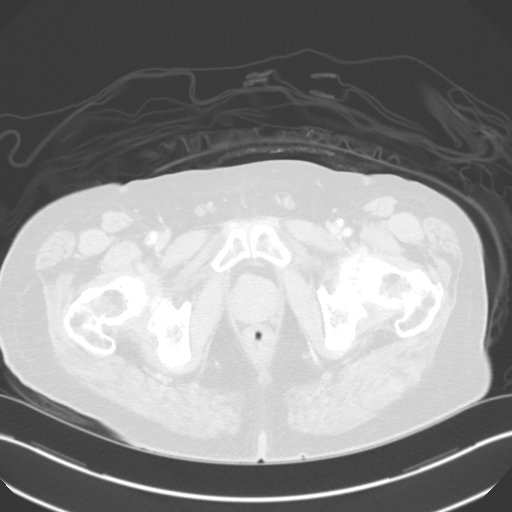
[im 24/130  lung]
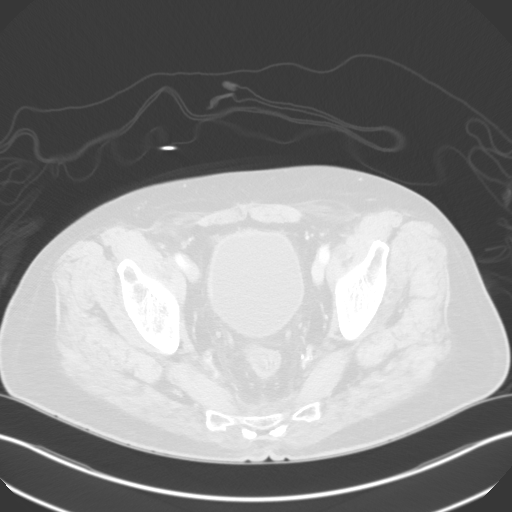
[im 36/130  lung]
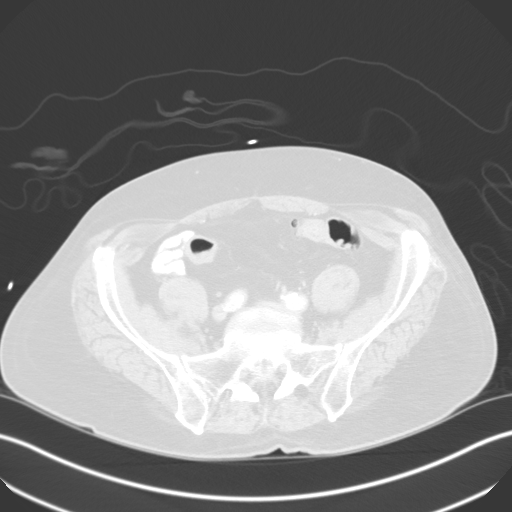
[im 47/130  lung]
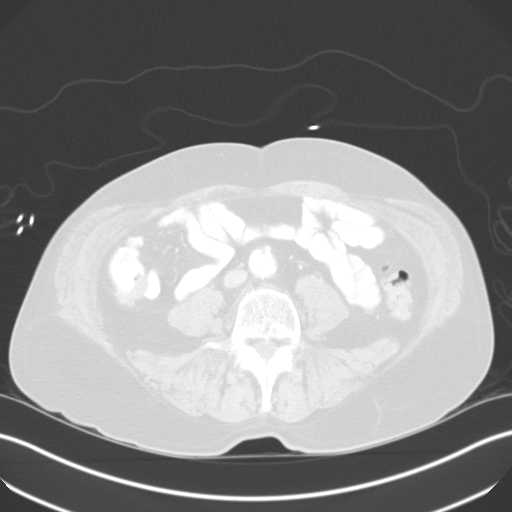
[im 71/130  mediastinal]
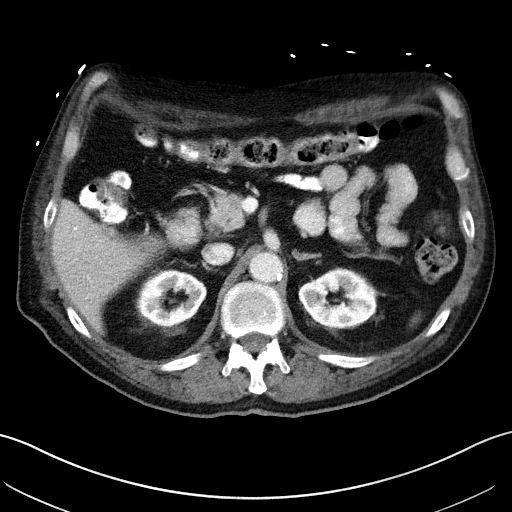
[im 71/130  lung]
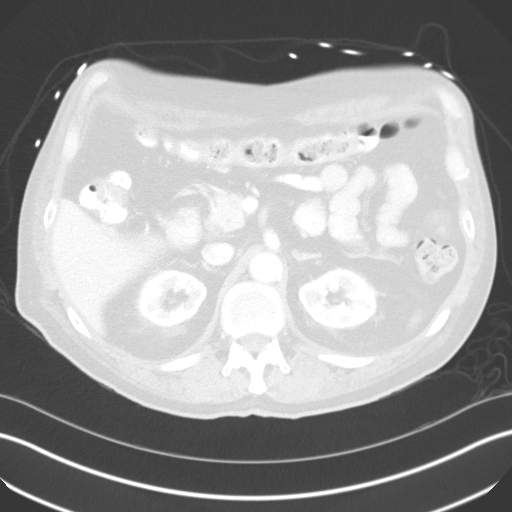
[im 83/130  lung]
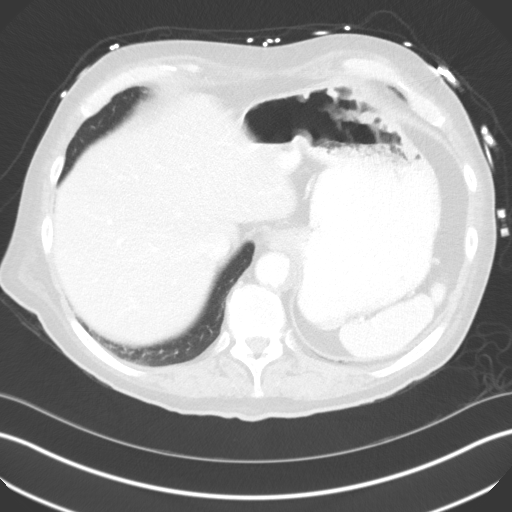
[im 94/130  lung]
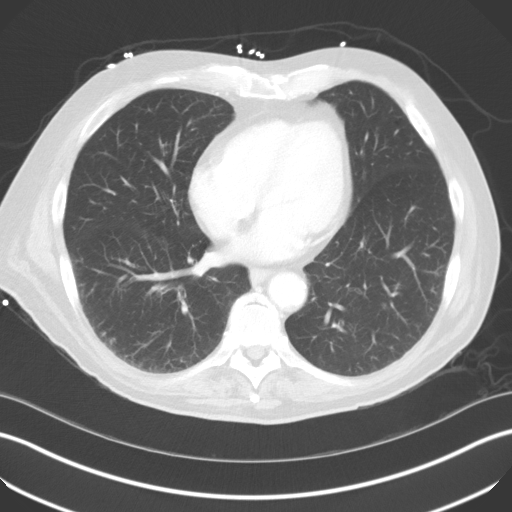
[im 106/130  lung]
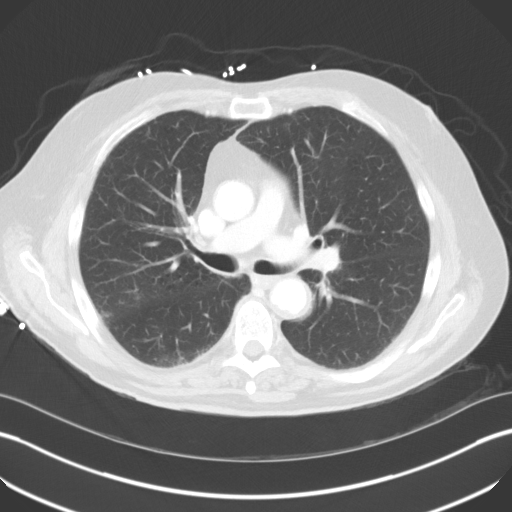
[im 118/130  mediastinal]
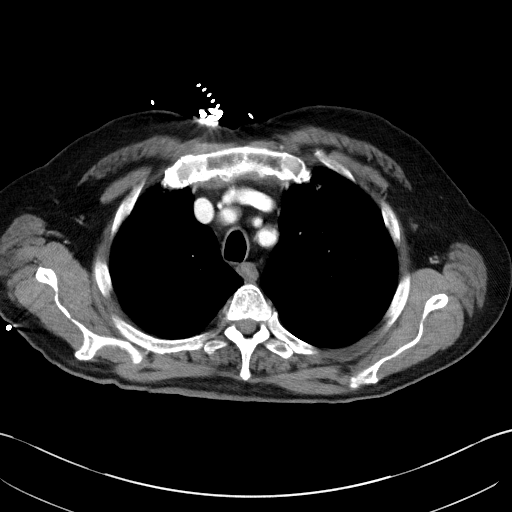
[im 118/130  lung]
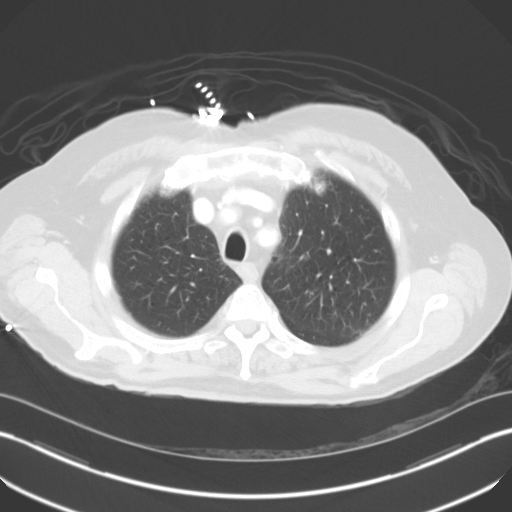

[Series 5: coronals · coronal · 0.82mm/px · 3 of 142 slices shown]
[im 29/142  lung]
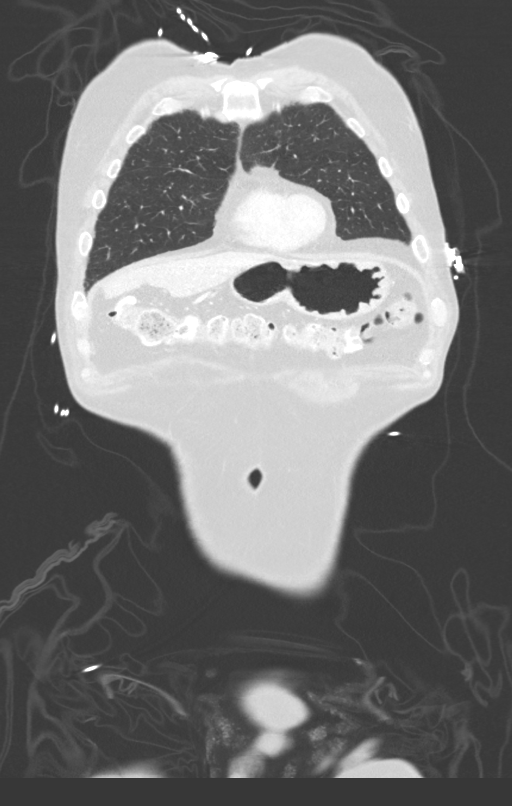
[im 57/142  lung]
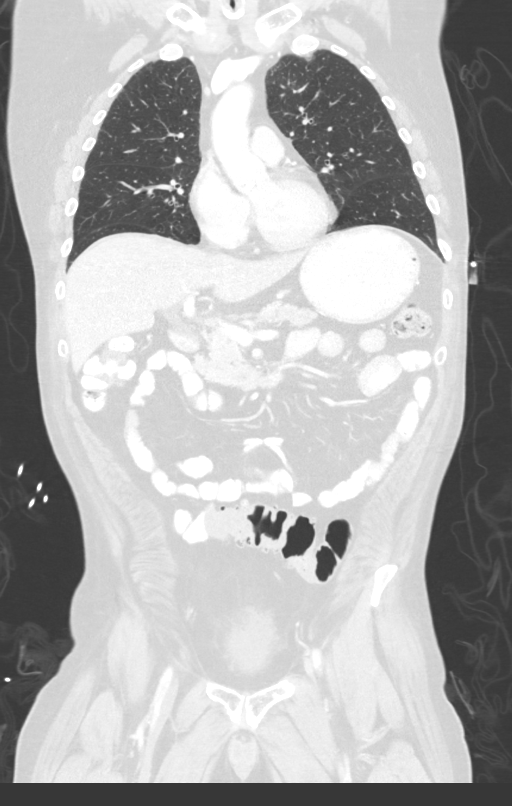
[im 85/142  lung]
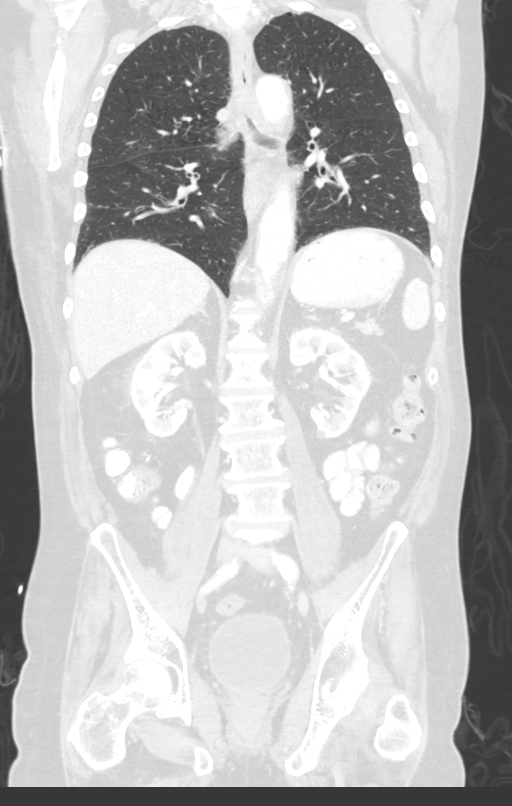

[12 of 36 positions shown; findings below may reference images not displayed]

FINDINGS: CT CHEST FINDINGS

Cardiovascular: Normal heart size. No significant pericardial
fluid/thickening. Left anterior descending coronary atherosclerosis.
Atherosclerotic nonaneurysmal thoracic aorta. Normal caliber
pulmonary arteries. No central pulmonary emboli.

Mediastinum/Nodes: No discrete thyroid nodules. Unremarkable
esophagus. No pathologically enlarged axillary, mediastinal or hilar
lymph nodes.

Lungs/Pleura: No pneumothorax. No pleural effusion. Mild
centrilobular and paraseptal emphysema with mild diffuse bronchial
wall thickening. Irregular 0.9 cm posterior right lower lobe
pulmonary nodule (series 4/ image 101), previously 0.6 cm on
10/23/2014 and 0.5 cm on 07/04/2014, mildly increased. No acute
consolidative airspace disease, lung masses or additional
significant pulmonary nodules. Mild patchy nonspecific subpleural
reticulation throughout both lungs is increased since 10/23/2014. No
significant regions of traction bronchiectasis or frank
honeycombing.

Musculoskeletal: No aggressive appearing focal osseous lesions.
Moderate thoracic spondylosis.

CT ABDOMEN PELVIS FINDINGS

Hepatobiliary: Normal liver with no liver mass. Cholecystectomy. No
biliary ductal dilatation.

Pancreas: Normal, with no mass or duct dilation.

Spleen: Normal size. No mass.

Adrenals/Urinary Tract: Normal adrenals. No hydronephrosis.
Subcentimeter hypodense renal cortical lesion in the medial far
lower left kidney, too small to characterize, stable, most
consistent with a benign renal cyst. No additional renal lesions.
Nonspecific mild asymmetric right lateral and right anterior bladder
wall thickening (series 2/ image 108), without discrete bladder
mass.

Stomach/Bowel: Grossly normal stomach. Normal caliber small bowel
with no small bowel wall thickening. Appendix not discretely
visualized. Mild diverticulosis of the sigmoid colon, with no large
bowel wall thickening or pericolonic fat stranding.

Vascular/Lymphatic: Atherosclerotic nonaneurysmal abdominal aorta.
Patent portal, splenic and renal veins. No pathologically enlarged
lymph nodes in the abdomen or pelvis.

Reproductive: Stable normal size prostate.

Other: No pneumoperitoneum, ascites or focal fluid collection.

Musculoskeletal: No aggressive appearing focal osseous lesions.
Moderate lumbar spondylosis.
IMPRESSION: 1. Irregular 0.9 cm posterior right lower lobe pulmonary nodule,
mildly increased. The morphology and slow growth may indicate an
indolent primary bronchogenic adenocarcinoma. A solitary lung
metastasis is less likely but not excluded. Correlation with PET-CT
could be obtained as clinically warranted.
2. Otherwise no potential sites of metastatic disease in the chest,
abdomen or pelvis.
3. Nonspecific mild asymmetric right lateral and right anterior
bladder wall thickening, without discrete bladder mass. Sessile
bladder tumor recurrence cannot be excluded and correlation with
cystoscopy is advised.
4. Nonspecific mild patchy subpleural reticulation in both lungs,
increased. Early usual interstitial pneumonia (UIP) cannot be
excluded. Consider a follow-up high-resolution chest CT study in 12
months if clinically warranted.
5. Additional findings include aortic atherosclerosis, 1 vessel
coronary atherosclerosis, mild emphysema and mild sigmoid
diverticulosis.

## 2017-07-21 ENCOUNTER — Inpatient Hospital Stay: Payer: Medicare HMO

## 2017-07-21 ENCOUNTER — Inpatient Hospital Stay: Payer: Medicare HMO | Admitting: Internal Medicine
